# Patient Record
Sex: Male | Born: 1945 | Race: White | Hispanic: No | Marital: Married | Smoking: Never smoker
Health system: Southern US, Community
[De-identification: ages and names within clinical notes are randomized; demographics above are authoritative.]

## PROBLEM LIST (undated history)

## (undated) DIAGNOSIS — I10 Essential (primary) hypertension: Secondary | ICD-10-CM

## (undated) DIAGNOSIS — N289 Disorder of kidney and ureter, unspecified: Secondary | ICD-10-CM

## (undated) DIAGNOSIS — Z96651 Presence of right artificial knee joint: Secondary | ICD-10-CM

## (undated) DIAGNOSIS — R0602 Shortness of breath: Secondary | ICD-10-CM

## (undated) DIAGNOSIS — E785 Hyperlipidemia, unspecified: Secondary | ICD-10-CM

## (undated) DIAGNOSIS — K222 Esophageal obstruction: Secondary | ICD-10-CM

## (undated) DIAGNOSIS — R002 Palpitations: Secondary | ICD-10-CM

## (undated) DIAGNOSIS — I350 Nonrheumatic aortic (valve) stenosis: Secondary | ICD-10-CM

## (undated) HISTORY — DX: Disorder of kidney and ureter, unspecified: N28.9

## (undated) HISTORY — DX: Shortness of breath: R06.02

## (undated) HISTORY — DX: Nonrheumatic aortic (valve) stenosis: I35.0

## (undated) HISTORY — DX: Essential (primary) hypertension: I10

## (undated) HISTORY — DX: Esophageal obstruction: K22.2

## (undated) HISTORY — DX: Palpitations: R00.2

## (undated) HISTORY — DX: Hyperlipidemia, unspecified: E78.5

## (undated) HISTORY — DX: Presence of right artificial knee joint: Z96.651

---

## 2015-01-13 DIAGNOSIS — E785 Hyperlipidemia, unspecified: Secondary | ICD-10-CM

## 2015-01-13 DIAGNOSIS — N289 Disorder of kidney and ureter, unspecified: Secondary | ICD-10-CM

## 2015-01-13 DIAGNOSIS — I10 Essential (primary) hypertension: Secondary | ICD-10-CM | POA: Insufficient documentation

## 2015-01-13 HISTORY — DX: Essential (primary) hypertension: I10

## 2015-01-13 HISTORY — DX: Hyperlipidemia, unspecified: E78.5

## 2015-01-13 HISTORY — DX: Disorder of kidney and ureter, unspecified: N28.9

## 2015-08-24 DIAGNOSIS — Z96651 Presence of right artificial knee joint: Secondary | ICD-10-CM

## 2015-08-24 HISTORY — DX: Presence of right artificial knee joint: Z96.651

## 2016-04-19 DIAGNOSIS — R351 Nocturia: Secondary | ICD-10-CM | POA: Diagnosis not present

## 2016-04-19 DIAGNOSIS — N401 Enlarged prostate with lower urinary tract symptoms: Secondary | ICD-10-CM | POA: Diagnosis not present

## 2016-04-19 DIAGNOSIS — N411 Chronic prostatitis: Secondary | ICD-10-CM | POA: Diagnosis not present

## 2016-04-25 DIAGNOSIS — J309 Allergic rhinitis, unspecified: Secondary | ICD-10-CM | POA: Diagnosis not present

## 2016-04-25 DIAGNOSIS — R197 Diarrhea, unspecified: Secondary | ICD-10-CM | POA: Diagnosis not present

## 2016-04-25 DIAGNOSIS — Z6830 Body mass index (BMI) 30.0-30.9, adult: Secondary | ICD-10-CM | POA: Diagnosis not present

## 2016-05-09 DIAGNOSIS — K589 Irritable bowel syndrome without diarrhea: Secondary | ICD-10-CM | POA: Diagnosis not present

## 2016-05-09 DIAGNOSIS — Z6830 Body mass index (BMI) 30.0-30.9, adult: Secondary | ICD-10-CM | POA: Diagnosis not present

## 2016-05-24 DIAGNOSIS — L570 Actinic keratosis: Secondary | ICD-10-CM | POA: Diagnosis not present

## 2016-06-14 DIAGNOSIS — H25013 Cortical age-related cataract, bilateral: Secondary | ICD-10-CM | POA: Diagnosis not present

## 2016-06-14 DIAGNOSIS — H353121 Nonexudative age-related macular degeneration, left eye, early dry stage: Secondary | ICD-10-CM | POA: Diagnosis not present

## 2016-07-11 DIAGNOSIS — R7301 Impaired fasting glucose: Secondary | ICD-10-CM | POA: Diagnosis not present

## 2016-07-11 DIAGNOSIS — K589 Irritable bowel syndrome without diarrhea: Secondary | ICD-10-CM | POA: Diagnosis not present

## 2016-07-11 DIAGNOSIS — E785 Hyperlipidemia, unspecified: Secondary | ICD-10-CM | POA: Diagnosis not present

## 2016-07-11 DIAGNOSIS — I10 Essential (primary) hypertension: Secondary | ICD-10-CM | POA: Diagnosis not present

## 2016-07-11 DIAGNOSIS — Z9181 History of falling: Secondary | ICD-10-CM | POA: Diagnosis not present

## 2016-07-11 DIAGNOSIS — Z6830 Body mass index (BMI) 30.0-30.9, adult: Secondary | ICD-10-CM | POA: Diagnosis not present

## 2016-07-11 DIAGNOSIS — E039 Hypothyroidism, unspecified: Secondary | ICD-10-CM | POA: Diagnosis not present

## 2016-07-11 DIAGNOSIS — K645 Perianal venous thrombosis: Secondary | ICD-10-CM | POA: Diagnosis not present

## 2016-07-11 DIAGNOSIS — Z1389 Encounter for screening for other disorder: Secondary | ICD-10-CM | POA: Diagnosis not present

## 2016-07-22 DIAGNOSIS — Z6830 Body mass index (BMI) 30.0-30.9, adult: Secondary | ICD-10-CM | POA: Diagnosis not present

## 2016-07-22 DIAGNOSIS — J302 Other seasonal allergic rhinitis: Secondary | ICD-10-CM | POA: Diagnosis not present

## 2016-10-04 DIAGNOSIS — N419 Inflammatory disease of prostate, unspecified: Secondary | ICD-10-CM | POA: Diagnosis not present

## 2016-10-04 DIAGNOSIS — Z6830 Body mass index (BMI) 30.0-30.9, adult: Secondary | ICD-10-CM | POA: Diagnosis not present

## 2016-10-04 DIAGNOSIS — N401 Enlarged prostate with lower urinary tract symptoms: Secondary | ICD-10-CM | POA: Diagnosis not present

## 2016-11-22 DIAGNOSIS — L570 Actinic keratosis: Secondary | ICD-10-CM | POA: Diagnosis not present

## 2016-12-13 DIAGNOSIS — H353131 Nonexudative age-related macular degeneration, bilateral, early dry stage: Secondary | ICD-10-CM | POA: Diagnosis not present

## 2016-12-13 DIAGNOSIS — H2513 Age-related nuclear cataract, bilateral: Secondary | ICD-10-CM | POA: Diagnosis not present

## 2017-01-11 DIAGNOSIS — I10 Essential (primary) hypertension: Secondary | ICD-10-CM | POA: Diagnosis not present

## 2017-01-11 DIAGNOSIS — E785 Hyperlipidemia, unspecified: Secondary | ICD-10-CM | POA: Diagnosis not present

## 2017-01-11 DIAGNOSIS — R7301 Impaired fasting glucose: Secondary | ICD-10-CM | POA: Diagnosis not present

## 2017-01-11 DIAGNOSIS — Z139 Encounter for screening, unspecified: Secondary | ICD-10-CM | POA: Diagnosis not present

## 2017-01-11 DIAGNOSIS — Z125 Encounter for screening for malignant neoplasm of prostate: Secondary | ICD-10-CM | POA: Diagnosis not present

## 2017-01-11 DIAGNOSIS — E039 Hypothyroidism, unspecified: Secondary | ICD-10-CM | POA: Diagnosis not present

## 2017-01-11 DIAGNOSIS — Z6830 Body mass index (BMI) 30.0-30.9, adult: Secondary | ICD-10-CM | POA: Diagnosis not present

## 2017-02-02 DIAGNOSIS — Z1211 Encounter for screening for malignant neoplasm of colon: Secondary | ICD-10-CM | POA: Diagnosis not present

## 2017-02-02 DIAGNOSIS — Z1389 Encounter for screening for other disorder: Secondary | ICD-10-CM | POA: Diagnosis not present

## 2017-02-02 DIAGNOSIS — E785 Hyperlipidemia, unspecified: Secondary | ICD-10-CM | POA: Diagnosis not present

## 2017-02-02 DIAGNOSIS — Z6830 Body mass index (BMI) 30.0-30.9, adult: Secondary | ICD-10-CM | POA: Diagnosis not present

## 2017-02-02 DIAGNOSIS — E669 Obesity, unspecified: Secondary | ICD-10-CM | POA: Diagnosis not present

## 2017-02-02 DIAGNOSIS — Z0000 Encounter for general adult medical examination without abnormal findings: Secondary | ICD-10-CM | POA: Diagnosis not present

## 2017-02-02 DIAGNOSIS — Z136 Encounter for screening for cardiovascular disorders: Secondary | ICD-10-CM | POA: Diagnosis not present

## 2017-02-02 DIAGNOSIS — Z9181 History of falling: Secondary | ICD-10-CM | POA: Diagnosis not present

## 2017-02-02 DIAGNOSIS — Z125 Encounter for screening for malignant neoplasm of prostate: Secondary | ICD-10-CM | POA: Diagnosis not present

## 2017-03-15 DIAGNOSIS — J019 Acute sinusitis, unspecified: Secondary | ICD-10-CM | POA: Diagnosis not present

## 2017-04-05 DIAGNOSIS — N3289 Other specified disorders of bladder: Secondary | ICD-10-CM | POA: Diagnosis not present

## 2017-04-05 DIAGNOSIS — N401 Enlarged prostate with lower urinary tract symptoms: Secondary | ICD-10-CM | POA: Diagnosis not present

## 2017-04-26 ENCOUNTER — Encounter: Payer: Self-pay | Admitting: *Deleted

## 2017-05-03 ENCOUNTER — Ambulatory Visit: Payer: PPO | Admitting: Cardiology

## 2017-05-03 ENCOUNTER — Encounter: Payer: Self-pay | Admitting: Cardiology

## 2017-05-03 VITALS — BP 124/70 | HR 65 | Ht 69.0 in | Wt 199.0 lb

## 2017-05-03 DIAGNOSIS — N289 Disorder of kidney and ureter, unspecified: Secondary | ICD-10-CM

## 2017-05-03 DIAGNOSIS — I10 Essential (primary) hypertension: Secondary | ICD-10-CM

## 2017-05-03 DIAGNOSIS — E785 Hyperlipidemia, unspecified: Secondary | ICD-10-CM | POA: Diagnosis not present

## 2017-05-03 NOTE — Patient Instructions (Signed)
Medication Instructions:  Your physician recommends that you continue on your current medications as directed. Please refer to the Current Medication list given to you today.  Labwork: None  Testing/Procedures: None  Follow-Up: Your physician recommends that you schedule a follow-up appointment in: 8 months  Any Other Special Instructions Will Be Listed Below (If Applicable).     If you need a refill on your cardiac medications before your next appointment, please call your pharmacy.   [facility]  Ashley A, RN, BSN  

## 2017-05-03 NOTE — Progress Notes (Addendum)
Cardiology Office Note:    Date:  05/03/2017   ID:  Duane Hamilton, DOB 1945-09-10, MRN [id number]  PCP:  Nicoletta Dress, MD  Cardiologist:  Jenean Lindau, MD   Referring MD: No ref. provider found    ASSESSMENT:    1. Essential hypertension   2. Renal insufficiency, mild   3. Dyslipidemia    PLAN:    In order of problems listed above:  1. Primary prevention stressed with the patient.  Importance of compliance with diet and medications stressed and he vocalized understanding.  His blood pressure is stable lipids are fine.  He has excellent exercise tolerance.  He is asymptomatic. 2. He will be seen in follow-up appointment in 8 months or earlier if he has any concerns. 3. Patient had blood lab work from his primary care physician's office and I reviewed this and discussed with him at length with the patient questions were answered to satisfaction.   Medication Adjustments/Labs and Tests Ordered: Current medicines are reviewed at length with the patient today.  Concerns regarding medicines are outlined above.  Orders Placed This Encounter  Procedures  . EKG 12-Lead   No orders of the defined types were placed in this encounter.    History of Present Illness:    Duane Hamilton is a 72 y.o. male who is being seen today for the evaluation of essential hypertension and dyslipidemia at the request of No ref. provider found.  This patient has been under my care in my previous practice.  He is here now to transfer his care and be established with my current practice.  Patient is a pleasant 71 year old male with past medical history of essential hypertension, renal insufficiency.  Past Medical History:  Diagnosis Date  . Aortic stenosis   . Dyslipidemia 01/13/2015  . Esophageal stricture   . Essential hypertension 01/13/2015  . Hyperlipidemia   . Palpitations   . Renal insufficiency, mild 01/13/2015  . 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 Shortness of  breath     History reviewed. No pertinent surgical history.  Current Medications: Current Meds  Medication Sig  . aspirin EC 81 MG tablet Take by mouth.  Marland Kitchen azelastine (ASTELIN) 0.1 % nasal spray 1 spray.  . cetirizine (ZYRTEC) 10 MG tablet TAKE 1 TABLET BY MOUTH AS NEEDED  . dicyclomine (BENTYL) 20 MG tablet Take 1 tablet by mouth 3 (three) times daily before meals.  Marland Kitchen levothyroxine (SYNTHROID, LEVOTHROID) 75 MCG tablet Take by mouth.  . metoprolol succinate (TOPROL-XL) 50 MG 24 hr tablet Take by mouth.  . Multiple Vitamins-Minerals (PRESERVISION AREDS PO) Take 1 tablet by mouth daily.  Marland Kitchen omeprazole (PRILOSEC) 20 MG capsule Take by mouth.  . simvastatin (ZOCOR) 40 MG tablet Take by mouth.  . tamsulosin (FLOMAX) 0.4 MG CAPS capsule Take 1 capsule by mouth daily.     Allergies:   Patient has no known allergies.   Social History   Socioeconomic History  . Marital status: Married    Spouse name: None  . Number of children: None  . Years of education: None  . Highest education level: None  Social Needs  . Financial resource strain: None  . Food insecurity - worry: None  . Food insecurity - inability: None  . Transportation needs - medical: None  . Transportation needs - non-medical: None  Occupational History  . None  Tobacco Use  . Smoking status: Never Smoker  . Smokeless tobacco: Never Used  Substance and Sexual  Activity  . Alcohol use: No    Frequency: Never  . Drug use: No  . Sexual activity: None  Other Topics Concern  . None  Social History Narrative  . None     Family History: The patient's family history includes Atrial fibrillation in his father.  ROS:   Please see the history of present illness.    All other systems reviewed and are negative.  EKGs/Labs/Other Studies Reviewed:    The following studies were reviewed today: EKG done today revealed sinus rhythm and nonspecific ST-T changes.   Recent Labs: No results found for requested labs within  last 8760 hours.  Recent Lipid Panel No results found for: CHOL, TRIG, HDL, CHOLHDL, VLDL, LDLCALC, LDLDIRECT  Physical Exam:    VS:  BP 124/70 (BP Location: Right Arm, Patient Position: Sitting, Cuff Size: Large)   Pulse 65   Ht 5\' 9"  (1.753 m)   Wt 199 lb 0.6 oz (90.3 kg)   SpO2 100%   BMI 29.39 kg/m     Wt Readings from Last 3 Encounters:  05/03/17 199 lb 0.6 oz (90.3 kg)     GEN: Patient is in no acute distress HEENT: Normal NECK: No JVD; No carotid bruits LYMPHATICS: No lymphadenopathy CARDIAC: S1 S2 regular, 2/6 systolic murmur at the apex. RESPIRATORY:  Clear to auscultation without rales, wheezing or rhonchi  ABDOMEN: Soft, non-tender, non-distended MUSCULOSKELETAL:  No edema; No deformity  SKIN: Warm and dry NEUROLOGIC:  Alert and oriented x 3 PSYCHIATRIC:  Normal affect    Signed, Jenean Lindau, MD  05/03/2017 11:05 AM    [place]

## 2017-05-30 DIAGNOSIS — L570 Actinic keratosis: Secondary | ICD-10-CM | POA: Diagnosis not present

## 2017-06-15 DIAGNOSIS — H524 Presbyopia: Secondary | ICD-10-CM | POA: Diagnosis not present

## 2017-06-15 DIAGNOSIS — H25813 Combined forms of age-related cataract, bilateral: Secondary | ICD-10-CM | POA: Diagnosis not present

## 2017-06-15 DIAGNOSIS — H353131 Nonexudative age-related macular degeneration, bilateral, early dry stage: Secondary | ICD-10-CM | POA: Diagnosis not present

## 2017-07-13 DIAGNOSIS — E039 Hypothyroidism, unspecified: Secondary | ICD-10-CM | POA: Diagnosis not present

## 2017-07-13 DIAGNOSIS — I10 Essential (primary) hypertension: Secondary | ICD-10-CM | POA: Diagnosis not present

## 2017-07-13 DIAGNOSIS — Z139 Encounter for screening, unspecified: Secondary | ICD-10-CM | POA: Diagnosis not present

## 2017-07-13 DIAGNOSIS — E785 Hyperlipidemia, unspecified: Secondary | ICD-10-CM | POA: Diagnosis not present

## 2017-07-13 DIAGNOSIS — Z1331 Encounter for screening for depression: Secondary | ICD-10-CM | POA: Diagnosis not present

## 2017-07-13 DIAGNOSIS — Z6830 Body mass index (BMI) 30.0-30.9, adult: Secondary | ICD-10-CM | POA: Diagnosis not present

## 2017-07-13 DIAGNOSIS — R7301 Impaired fasting glucose: Secondary | ICD-10-CM | POA: Diagnosis not present

## 2017-07-27 DIAGNOSIS — K210 Gastro-esophageal reflux disease with esophagitis: Secondary | ICD-10-CM | POA: Diagnosis not present

## 2017-07-27 DIAGNOSIS — Z8719 Personal history of other diseases of the digestive system: Secondary | ICD-10-CM | POA: Diagnosis not present

## 2017-08-14 DIAGNOSIS — J019 Acute sinusitis, unspecified: Secondary | ICD-10-CM | POA: Diagnosis not present

## 2017-08-14 DIAGNOSIS — Z6831 Body mass index (BMI) 31.0-31.9, adult: Secondary | ICD-10-CM | POA: Diagnosis not present

## 2017-08-24 DIAGNOSIS — J019 Acute sinusitis, unspecified: Secondary | ICD-10-CM | POA: Diagnosis not present

## 2017-10-05 DIAGNOSIS — N3289 Other specified disorders of bladder: Secondary | ICD-10-CM | POA: Diagnosis not present

## 2017-10-05 DIAGNOSIS — N401 Enlarged prostate with lower urinary tract symptoms: Secondary | ICD-10-CM | POA: Diagnosis not present

## 2017-10-21 DIAGNOSIS — S6392XA Sprain of unspecified part of left wrist and hand, initial encounter: Secondary | ICD-10-CM | POA: Diagnosis not present

## 2017-10-21 DIAGNOSIS — T7840XA Allergy, unspecified, initial encounter: Secondary | ICD-10-CM | POA: Diagnosis not present

## 2017-10-21 DIAGNOSIS — M79641 Pain in right hand: Secondary | ICD-10-CM | POA: Diagnosis not present

## 2017-11-28 DIAGNOSIS — L570 Actinic keratosis: Secondary | ICD-10-CM | POA: Diagnosis not present

## 2017-12-02 DIAGNOSIS — I499 Cardiac arrhythmia, unspecified: Secondary | ICD-10-CM | POA: Diagnosis not present

## 2017-12-02 DIAGNOSIS — R002 Palpitations: Secondary | ICD-10-CM | POA: Diagnosis not present

## 2017-12-02 DIAGNOSIS — R072 Precordial pain: Secondary | ICD-10-CM | POA: Diagnosis not present

## 2017-12-02 DIAGNOSIS — R7989 Other specified abnormal findings of blood chemistry: Secondary | ICD-10-CM | POA: Diagnosis not present

## 2017-12-02 DIAGNOSIS — I10 Essential (primary) hypertension: Secondary | ICD-10-CM | POA: Diagnosis not present

## 2017-12-02 DIAGNOSIS — I4891 Unspecified atrial fibrillation: Secondary | ICD-10-CM | POA: Diagnosis not present

## 2017-12-02 DIAGNOSIS — R000 Tachycardia, unspecified: Secondary | ICD-10-CM | POA: Diagnosis not present

## 2017-12-02 DIAGNOSIS — K219 Gastro-esophageal reflux disease without esophagitis: Secondary | ICD-10-CM | POA: Diagnosis not present

## 2017-12-02 DIAGNOSIS — R0789 Other chest pain: Secondary | ICD-10-CM | POA: Diagnosis not present

## 2017-12-02 DIAGNOSIS — E785 Hyperlipidemia, unspecified: Secondary | ICD-10-CM | POA: Diagnosis not present

## 2017-12-02 DIAGNOSIS — R079 Chest pain, unspecified: Secondary | ICD-10-CM | POA: Diagnosis not present

## 2017-12-02 DIAGNOSIS — E860 Dehydration: Secondary | ICD-10-CM | POA: Diagnosis not present

## 2017-12-02 DIAGNOSIS — N179 Acute kidney failure, unspecified: Secondary | ICD-10-CM | POA: Diagnosis not present

## 2017-12-03 DIAGNOSIS — R002 Palpitations: Secondary | ICD-10-CM | POA: Diagnosis not present

## 2017-12-03 DIAGNOSIS — K219 Gastro-esophageal reflux disease without esophagitis: Secondary | ICD-10-CM | POA: Diagnosis not present

## 2017-12-03 DIAGNOSIS — I10 Essential (primary) hypertension: Secondary | ICD-10-CM | POA: Diagnosis not present

## 2017-12-03 DIAGNOSIS — R079 Chest pain, unspecified: Secondary | ICD-10-CM

## 2017-12-03 DIAGNOSIS — N179 Acute kidney failure, unspecified: Secondary | ICD-10-CM | POA: Diagnosis not present

## 2017-12-03 DIAGNOSIS — E785 Hyperlipidemia, unspecified: Secondary | ICD-10-CM | POA: Diagnosis not present

## 2017-12-04 DIAGNOSIS — R079 Chest pain, unspecified: Secondary | ICD-10-CM | POA: Diagnosis not present

## 2017-12-04 DIAGNOSIS — N179 Acute kidney failure, unspecified: Secondary | ICD-10-CM | POA: Diagnosis not present

## 2017-12-05 ENCOUNTER — Encounter: Payer: Self-pay | Admitting: Cardiology

## 2017-12-05 ENCOUNTER — Ambulatory Visit (INDEPENDENT_AMBULATORY_CARE_PROVIDER_SITE_OTHER): Payer: PPO | Admitting: Cardiology

## 2017-12-05 VITALS — BP 118/64 | HR 80 | Resp 10 | Ht 69.0 in | Wt 199.1 lb

## 2017-12-05 DIAGNOSIS — N289 Disorder of kidney and ureter, unspecified: Secondary | ICD-10-CM | POA: Diagnosis not present

## 2017-12-05 DIAGNOSIS — I2510 Atherosclerotic heart disease of native coronary artery without angina pectoris: Secondary | ICD-10-CM

## 2017-12-05 DIAGNOSIS — I10 Essential (primary) hypertension: Secondary | ICD-10-CM

## 2017-12-05 DIAGNOSIS — E785 Hyperlipidemia, unspecified: Secondary | ICD-10-CM

## 2017-12-05 HISTORY — DX: Atherosclerotic heart disease of native coronary artery without angina pectoris: I25.10

## 2017-12-05 NOTE — Progress Notes (Signed)
Cardiology Office Note:    Date:  12/05/2017   ID:  CROSLEY STEJSKAL, DOB Feb 12, 1946, MRN [id number]  PCP:  Nicoletta Dress, MD  Cardiologist:  Jenean Lindau, MD   Referring MD: Nicoletta Dress, MD    ASSESSMENT:    1. Essential hypertension   2. Renal insufficiency, mild   3. Dyslipidemia   4. Coronary artery disease involving native coronary artery of native heart without angina pectoris    PLAN:    In order of problems listed above:  1. Secondary prevention stressed with the patient.  Importance of compliance with diet and medication stressed and he vocalized understanding.  A CT scan to rule out PE revealed significant coronary atherosclerosis. 2. This we will monitor the patient's lipids aggressively. 3. He also has renal insufficiency and received dye for the CT scan.  I have asked him to come back in 2 weeks with a pulse blood pressure check.  He will keep a track of his blood pressures at home and bring them to me.  This will be done at the hospital office but he will also get a Chem-7. 4. He will be back in a month for blood work including liver lipid check 5. Patient will be seen in follow-up appointment in 6 months or earlier if the patient has any concerns 6. Importance of regular exercise stressed in detail to him.  He vocalized understanding   Medication Adjustments/Labs and Tests Ordered: Current medicines are reviewed at length with the patient today.  Concerns regarding medicines are outlined above.  Orders Placed This Encounter  Procedures  . Basic Metabolic Panel (BMET)  . Basic Metabolic Panel (BMET)  . TSH  . Hepatic function panel  . Lipid Profile   No orders of the defined types were placed in this encounter.    Chief Complaint  Patient presents with  . Follow-up  . Hospitalization Follow-up     History of Present Illness:    TREYVIN GLIDDEN is a 72 y.o. male.  The patient was admitted to [facility].  He mentions to me that he  had an episode of palpitations and went to [place] hospital.  His son accompanies him for this visit and mentions to me that the patient had overexerted himself for a couple of days even leading up to the day that he went to the hospital.  No orthopnea or PND.  He had some issues of chest pain.  Past Medical History:  Diagnosis Date  . Aortic stenosis   . Dyslipidemia 01/13/2015  . Esophageal stricture   . Essential hypertension 01/13/2015  . Hyperlipidemia   . Palpitations   . Renal insufficiency, mild 01/13/2015  . 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 Shortness of breath     History reviewed. No pertinent surgical history.  Current Medications: Current Meds  Medication Sig  . aspirin EC 81 MG tablet Take by mouth.  Marland Kitchen azelastine (ASTELIN) 0.1 % nasal spray 1 spray.  . cetirizine (ZYRTEC) 10 MG tablet TAKE 1 TABLET BY MOUTH AS NEEDED  . levothyroxine (SYNTHROID, LEVOTHROID) 75 MCG tablet Take by mouth.  Marland Kitchen lisinopril (PRINIVIL,ZESTRIL) 2.5 MG tablet Take 1 tablet by mouth daily.  . metoprolol succinate (TOPROL-XL) 50 MG 24 hr tablet Take by mouth.  . Multiple Vitamins-Minerals (PRESERVISION AREDS PO) Take 1 tablet by mouth daily.  . pantoprazole (PROTONIX) 40 MG tablet Take 1 tablet by mouth daily.  . simvastatin (ZOCOR) 40 MG tablet Take by mouth.  Marland Kitchen  tamsulosin (FLOMAX) 0.4 MG CAPS capsule Take 1 capsule by mouth daily.     Allergies:   Patient has no known allergies.   Social History   Socioeconomic History  . Marital status: Married    Spouse name: Not on file  . Number of children: Not on file  . Years of education: Not on file  . Highest education level: Not on file  Occupational History  . Not on file  Social Needs  . Financial resource strain: Not on file  . Food insecurity:    Worry: Not on file    Inability: Not on file  . Transportation needs:    Medical: Not on file    Non-medical: Not on file  Tobacco Use  . Smoking status: Never Smoker   . Smokeless tobacco: Never Used  Substance and Sexual Activity  . Alcohol use: No    Frequency: Never  . Drug use: No  . Sexual activity: Not on file  Lifestyle  . Physical activity:    Days per week: Not on file    Minutes per session: Not on file  . Stress: Not on file  Relationships  . Social connections:    Talks on phone: Not on file    Gets together: Not on file    Attends religious service: Not on file    Active member of club or organization: Not on file    Attends meetings of clubs or organizations: Not on file    Relationship status: Not on file  Other Topics Concern  . Not on file  Social History Narrative  . Not on file     Family History: The patient's family history includes Atrial fibrillation in his father.  ROS:   Please see the history of present illness.    All other systems reviewed and are negative.  EKGs/Labs/Other Studies Reviewed:    The following studies were reviewed today: I reviewed hospital records extensively and discussed with the patient at length.   Recent Labs: No results found for requested labs within last 8760 hours.  Recent Lipid Panel No results found for: CHOL, TRIG, HDL, CHOLHDL, VLDL, LDLCALC, LDLDIRECT  Physical Exam:    VS:  BP 118/64   Pulse 80   Resp 10   Ht 5\' 9"  (1.753 m)   Wt 199 lb 1.9 oz (90.3 kg)   BMI 29.40 kg/m     Wt Readings from Last 3 Encounters:  12/05/17 199 lb 1.9 oz (90.3 kg)  05/03/17 199 lb 0.6 oz (90.3 kg)     GEN: Patient is in no acute distress HEENT: Normal NECK: No JVD; No carotid bruits LYMPHATICS: No lymphadenopathy CARDIAC: Hear sounds regular, 2/6 systolic murmur at the apex. RESPIRATORY:  Clear to auscultation without rales, wheezing or rhonchi  ABDOMEN: Soft, non-tender, non-distended MUSCULOSKELETAL:  No edema; No deformity  SKIN: Warm and dry NEUROLOGIC:  Alert and oriented x 3 PSYCHIATRIC:  Normal affect   Signed, Jenean Lindau, MD  12/05/2017 10:43 AM    [place]

## 2017-12-05 NOTE — Patient Instructions (Addendum)
Medication Instructions:  Your physician recommends that you continue on your current medications as directed. Please refer to the Current Medication list given to you today.   Labwork: Your physician recommends that you return for lab work in 6 weeks (01/16/18): BMP, TSH, LFT, lipid panel. Please fast beforehand. You can go to our [place] office to have this lab work completed located at [facility] in [place].   Testing/Procedures: None  Follow-Up: Your physician wants you to follow-up in: 6 months. You will receive a reminder letter in the mail two months in advance. If you don't receive a letter, please call our office to schedule the follow-up appointment.  **Dr. Geraldo Pitter recommends you keep a log of your daily blood pressure and heart rate. Bring this information with you to your nurse visit appointment in the [place] office in 2 weeks.    If you need a refill on your cardiac medications before your next appointment, please call your pharmacy.   Thank you for choosing [facility]! Robyne Peers, RN [phone]

## 2017-12-11 ENCOUNTER — Other Ambulatory Visit: Payer: Self-pay

## 2017-12-11 NOTE — Patient Outreach (Addendum)
[facility]) Care Management  12/11/2017  Duane Hamilton [birth year] [id number]  Telephone screening Referral date: 12/11/17 Referral source: discharged from [facility] on 12/04/17 Insurance: health team advantage Attempt # 1  Telephone call to patient regarding transition of care referral. Unable to reach patient. HIPAA compliant voice message left with call back phone number.   PLAN: RNCM will attempt 2nd telephone call to patient within 4 business days.  RNCM will send patient outreach letter to attempt contact  Quinn Plowman RN,BSN,CCM [facility] Telephonic  [phone]

## 2017-12-12 ENCOUNTER — Other Ambulatory Visit: Payer: Self-pay

## 2017-12-12 NOTE — Patient Outreach (Signed)
[facility]) Care Management  12/12/2017  Duane Hamilton 02-Apr-1946 [id number]  Transition of care  Referral date: 12/11/17 Referral source:  Discharged from [facility] on 12/04/17 Insurance: health team advantage  Transition of care will be completed by primary care provider office who will refer to [facility] care management if needed.   PLAN: RNCM will close patient due to patient being enrolled in an external program.  Quinn Plowman RN,BSN,CCM [facility] Telephonic  [phone]

## 2017-12-14 ENCOUNTER — Ambulatory Visit: Payer: Self-pay

## 2017-12-15 ENCOUNTER — Other Ambulatory Visit: Payer: Self-pay

## 2017-12-15 NOTE — Patient Outreach (Signed)
[facility]) Care Management  12/15/2017  AIMAR BORGHI 1945-06-02 [id number]  Transition of care  Referral date: 12/11/17 Referral source:  Discharged from [facility] on 12/04/17 Insurance: health team advantage   Transition of care will be completed by primary care provider office who will refer to [facility] care management if needed.   PLAN: RNCM will close patient due to patient being enrolled in an external program.  Quinn Plowman RN,BSN,CCM [facility] Telephonic  [phone]

## 2017-12-19 ENCOUNTER — Ambulatory Visit (INDEPENDENT_AMBULATORY_CARE_PROVIDER_SITE_OTHER): Payer: PPO | Admitting: Cardiology

## 2017-12-19 VITALS — BP 140/70 | HR 60 | Resp 16 | Ht 69.0 in | Wt 203.0 lb

## 2017-12-19 DIAGNOSIS — I10 Essential (primary) hypertension: Secondary | ICD-10-CM

## 2017-12-19 MED ORDER — LISINOPRIL 2.5 MG PO TABS: 2.5000 mg | ORAL_TABLET | Freq: Every day | ORAL | 3 refills | Status: DC

## 2017-12-19 NOTE — Patient Instructions (Signed)
Medication Instructions:  Your physician recommends that you continue on your current medications as directed. Please refer to the Current Medication list given to you today.   Labwork: None.  Testing/Procedures: None  Follow-Up: Follow up as previously directed.   Any Other Special Instructions Will Be Listed Below (If Applicable).     If you need a refill on your cardiac medications before your next appointment, please call your pharmacy.

## 2017-12-19 NOTE — Progress Notes (Signed)
Patient here for bp check per Dr. Geraldo Pitter. Patient bp 140/70 and patient brings copy of bp log at home. All values within normal limits. Dr. Bettina Gavia aware. Patient advised to continue current management. Patient verbally understands.

## 2018-01-01 DIAGNOSIS — Z86010 Personal history of colonic polyps: Secondary | ICD-10-CM | POA: Diagnosis not present

## 2018-01-12 DIAGNOSIS — Z23 Encounter for immunization: Secondary | ICD-10-CM | POA: Diagnosis not present

## 2018-01-12 DIAGNOSIS — E039 Hypothyroidism, unspecified: Secondary | ICD-10-CM | POA: Diagnosis not present

## 2018-01-12 DIAGNOSIS — K219 Gastro-esophageal reflux disease without esophagitis: Secondary | ICD-10-CM | POA: Diagnosis not present

## 2018-01-12 DIAGNOSIS — E785 Hyperlipidemia, unspecified: Secondary | ICD-10-CM | POA: Diagnosis not present

## 2018-01-12 DIAGNOSIS — R7301 Impaired fasting glucose: Secondary | ICD-10-CM | POA: Diagnosis not present

## 2018-01-12 DIAGNOSIS — Z6830 Body mass index (BMI) 30.0-30.9, adult: Secondary | ICD-10-CM | POA: Diagnosis not present

## 2018-01-12 DIAGNOSIS — R002 Palpitations: Secondary | ICD-10-CM | POA: Diagnosis not present

## 2018-01-12 DIAGNOSIS — I10 Essential (primary) hypertension: Secondary | ICD-10-CM | POA: Diagnosis not present

## 2018-01-17 ENCOUNTER — Telehealth: Payer: Self-pay

## 2018-01-17 ENCOUNTER — Other Ambulatory Visit: Payer: Self-pay

## 2018-01-17 DIAGNOSIS — R002 Palpitations: Secondary | ICD-10-CM

## 2018-01-17 NOTE — Telephone Encounter (Signed)
-----   Message from Jenean Lindau, MD sent at 01/17/2018  2:05 PM EDT ----- Regarding: RE: Patient states that he had labs at PCP  Contact: [phone] 48 hr holter ----- Message ----- From: Mattie Marlin, RN Sent: 01/17/2018  11:34 AM EDT To: Jenean Lindau, MD Subject: FW: Patient states that he had labs at PCP     Labs have been requested, please advise?  ----- Message ----- From: Frederic Jericho Sent: 01/16/2018   9:38 AM EDT To: Mattie Marlin, RN Subject: Patient states that he had labs at PCP         Patient states that he had his lab at [facility] and he also he mentioned that he has had som irratic heartrate on his BP monitor and Delena Bali mentioned that we might want to put on a monitor.

## 2018-01-17 NOTE — Telephone Encounter (Signed)
Informed patient that 48 hour monitor is being ordered.

## 2018-02-07 DIAGNOSIS — E039 Hypothyroidism, unspecified: Secondary | ICD-10-CM | POA: Diagnosis not present

## 2018-02-07 DIAGNOSIS — E860 Dehydration: Secondary | ICD-10-CM | POA: Diagnosis not present

## 2018-02-07 DIAGNOSIS — R000 Tachycardia, unspecified: Secondary | ICD-10-CM | POA: Diagnosis not present

## 2018-02-07 DIAGNOSIS — I10 Essential (primary) hypertension: Secondary | ICD-10-CM | POA: Diagnosis not present

## 2018-02-07 DIAGNOSIS — R002 Palpitations: Secondary | ICD-10-CM | POA: Diagnosis not present

## 2018-02-07 DIAGNOSIS — I491 Atrial premature depolarization: Secondary | ICD-10-CM | POA: Diagnosis not present

## 2018-02-07 DIAGNOSIS — K219 Gastro-esophageal reflux disease without esophagitis: Secondary | ICD-10-CM | POA: Diagnosis not present

## 2018-02-07 DIAGNOSIS — R531 Weakness: Secondary | ICD-10-CM | POA: Diagnosis not present

## 2018-02-07 DIAGNOSIS — N179 Acute kidney failure, unspecified: Secondary | ICD-10-CM | POA: Diagnosis not present

## 2018-02-09 ENCOUNTER — Ambulatory Visit (INDEPENDENT_AMBULATORY_CARE_PROVIDER_SITE_OTHER): Payer: PPO

## 2018-02-09 DIAGNOSIS — R002 Palpitations: Secondary | ICD-10-CM | POA: Diagnosis not present

## 2018-02-13 DIAGNOSIS — Z139 Encounter for screening, unspecified: Secondary | ICD-10-CM | POA: Diagnosis not present

## 2018-02-13 DIAGNOSIS — N179 Acute kidney failure, unspecified: Secondary | ICD-10-CM | POA: Diagnosis not present

## 2018-02-13 DIAGNOSIS — E785 Hyperlipidemia, unspecified: Secondary | ICD-10-CM | POA: Diagnosis not present

## 2018-02-13 DIAGNOSIS — Z125 Encounter for screening for malignant neoplasm of prostate: Secondary | ICD-10-CM | POA: Diagnosis not present

## 2018-02-13 DIAGNOSIS — Z1339 Encounter for screening examination for other mental health and behavioral disorders: Secondary | ICD-10-CM | POA: Diagnosis not present

## 2018-02-13 DIAGNOSIS — Z0000 Encounter for general adult medical examination without abnormal findings: Secondary | ICD-10-CM | POA: Diagnosis not present

## 2018-02-13 DIAGNOSIS — E669 Obesity, unspecified: Secondary | ICD-10-CM | POA: Diagnosis not present

## 2018-02-13 DIAGNOSIS — Z6831 Body mass index (BMI) 31.0-31.9, adult: Secondary | ICD-10-CM | POA: Diagnosis not present

## 2018-02-13 DIAGNOSIS — Z9181 History of falling: Secondary | ICD-10-CM | POA: Diagnosis not present

## 2018-02-20 DIAGNOSIS — R002 Palpitations: Secondary | ICD-10-CM | POA: Diagnosis not present

## 2018-02-20 DIAGNOSIS — S29012A Strain of muscle and tendon of back wall of thorax, initial encounter: Secondary | ICD-10-CM | POA: Diagnosis not present

## 2018-02-20 DIAGNOSIS — M940 Chondrocostal junction syndrome [Tietze]: Secondary | ICD-10-CM | POA: Diagnosis not present

## 2018-02-20 DIAGNOSIS — Z6831 Body mass index (BMI) 31.0-31.9, adult: Secondary | ICD-10-CM | POA: Diagnosis not present

## 2018-02-28 DIAGNOSIS — H353121 Nonexudative age-related macular degeneration, left eye, early dry stage: Secondary | ICD-10-CM | POA: Diagnosis not present

## 2018-02-28 DIAGNOSIS — H25813 Combined forms of age-related cataract, bilateral: Secondary | ICD-10-CM | POA: Diagnosis not present

## 2018-03-08 ENCOUNTER — Ambulatory Visit: Payer: PPO | Admitting: Cardiology

## 2018-03-12 ENCOUNTER — Encounter: Payer: Self-pay | Admitting: Cardiology

## 2018-03-12 ENCOUNTER — Ambulatory Visit (INDEPENDENT_AMBULATORY_CARE_PROVIDER_SITE_OTHER): Payer: PPO | Admitting: Cardiology

## 2018-03-12 VITALS — BP 140/76 | HR 64 | Ht 69.0 in | Wt 205.0 lb

## 2018-03-12 DIAGNOSIS — I10 Essential (primary) hypertension: Secondary | ICD-10-CM | POA: Diagnosis not present

## 2018-03-12 DIAGNOSIS — R002 Palpitations: Secondary | ICD-10-CM

## 2018-03-12 NOTE — Patient Instructions (Signed)
Medication Instructions:  Your physician recommends that you continue on your current medications as directed. Please refer to the Current Medication list given to you today.  If you need a refill on your cardiac medications before your next appointment, please call your pharmacy.   Lab work: None  If you have labs (blood work) drawn today and your tests are completely normal, you will receive your results only by: Marland Kitchen MyChart Message (if you have MyChart) OR . A paper copy in the mail If you have any lab test that is abnormal or we need to change your treatment, we will call you to review the results.  Testing/Procedures: Your physician has recommended that you wear a holter monitor. Holter monitors are medical devices that record the heart's electrical activity. Doctors most often use these monitors to diagnose arrhythmias. Arrhythmias are problems with the speed or rhythm of the heartbeat. The monitor is a small, portable device. You can wear one while you do your normal daily activities. This is usually used to diagnose what is causing palpitations/syncope (passing out).  Follow-Up: At [facility], you and your health needs are our priority.  As part of our continuing mission to provide you with exceptional heart care, we have created designated Provider Care Teams.  These Care Teams include your primary Cardiologist (physician) and Advanced Practice Providers (APPs -  Physician Assistants and Nurse Practitioners) who all work together to provide you with the care you need, when you need it.  You will need a follow up appointment in 6 months.  Please call our office 2 months in advance to schedule this appointment.  You may see another member of our [employer] Provider Team in [place]: Jenne Campus, MD . Shirlee More, MD  Any Other Special Instructions Will Be Listed Below (If Applicable).

## 2018-03-12 NOTE — Progress Notes (Signed)
Cardiology Office Note:    Date:  03/12/2018   ID:  Duane Hamilton, DOB November 16, 1945, MRN [id number]  PCP:  Nicoletta Dress, MD  Cardiologist:  Jenean Lindau, MD   Referring MD: Nicoletta Dress, MD    ASSESSMENT:    1. Essential hypertension   2. Palpitations    PLAN:    In order of problems listed above:  1. Primary prevention stressed to the patient.  Importance of compliance with diet and medication stressed and he vocalized understanding.  I reviewed lab work done by his primary care physician.  He has mild renal insufficiency.  He mentions to me that he walks half an hour on a regular basis without any symptoms. 2. His sensation of abnormal heartbeat and palpitations at times is of concern to him it generally happens at rest and therefore we will do a 14-day monitoring.  He tells me that his thyroid function done by his primary care physician is normal. 3. Patient will be seen in follow-up appointment in 6 months or earlier if the patient has any concerns    Medication Adjustments/Labs and Tests Ordered: Current medicines are reviewed at length with the patient today.  Concerns regarding medicines are outlined above.  Orders Placed This Encounter  Procedures  . LONG TERM MONITOR (3-14 DAYS)   No orders of the defined types were placed in this encounter.    No chief complaint on file.    History of Present Illness:    Duane Hamilton is a 72 y.o. male.  Patient has history of essential hypertension palpitations.  He also has renal insufficiency.  He denies any problems at this time and takes care of activities of daily living.  No chest pain orthopnea or PND.  Patient denies any chest pain.  He walks 30 minutes or most of the times without any problems.  He complains of a skipped beat-like sensation and may be palpitations at times at rest.  Past Medical History:  Diagnosis Date  . Aortic stenosis   . Dyslipidemia 01/13/2015  . Esophageal stricture   .  Essential hypertension 01/13/2015  . Hyperlipidemia   . Palpitations   . Renal insufficiency, mild 01/13/2015  . 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 Shortness of breath     History reviewed. No pertinent surgical history.  Current Medications: Current Meds  Medication Sig  . aspirin EC 81 MG tablet Take 81 mg by mouth daily.   Marland Kitchen azelastine (ASTELIN) 0.1 % nasal spray Place 2 sprays into both nostrils as needed.   . cetirizine (ZYRTEC) 10 MG tablet TAKE 1 TABLET BY MOUTH AS NEEDED  . levothyroxine (SYNTHROID, LEVOTHROID) 75 MCG tablet Take 75 mcg by mouth daily before breakfast.   . lisinopril (PRINIVIL,ZESTRIL) 2.5 MG tablet Take 1 tablet (2.5 mg total) by mouth daily.  . metoprolol succinate (TOPROL-XL) 50 MG 24 hr tablet Take 50 mg by mouth daily.   . Multiple Vitamins-Minerals (PRESERVISION AREDS PO) Take 1 tablet by mouth daily.  . pantoprazole (PROTONIX) 40 MG tablet Take 1 tablet by mouth daily.  . simvastatin (ZOCOR) 40 MG tablet Take 40 mg by mouth daily at 6 PM.   . tamsulosin (FLOMAX) 0.4 MG CAPS capsule Take 1 capsule by mouth daily.     Allergies:   Patient has no known allergies.   Social History   Socioeconomic History  . Marital status: Married    Spouse name: Not on file  . Number of children:  Not on file  . Years of education: Not on file  . Highest education level: Not on file  Occupational History  . Not on file  Social Needs  . Financial resource strain: Not on file  . Food insecurity:    Worry: Not on file    Inability: Not on file  . Transportation needs:    Medical: Not on file    Non-medical: Not on file  Tobacco Use  . Smoking status: Never Smoker  . Smokeless tobacco: Never Used  Substance and Sexual Activity  . Alcohol use: No    Frequency: Never  . Drug use: No  . Sexual activity: Not on file  Lifestyle  . Physical activity:    Days per week: Not on file    Minutes per session: Not on file  . Stress: Not on file    Relationships  . Social connections:    Talks on phone: Not on file    Gets together: Not on file    Attends religious service: Not on file    Active member of club or organization: Not on file    Attends meetings of clubs or organizations: Not on file    Relationship status: Not on file  Other Topics Concern  . Not on file  Social History Narrative  . Not on file     Family History: The patient's family history includes Atrial fibrillation in his father.  ROS:   Please see the history of present illness.    All other systems reviewed and are negative.  EKGs/Labs/Other Studies Reviewed:    The following studies were reviewed today: I discussed my findings with the patient at extensive length including the Holter monitor and stress test done in the past few months   Recent Labs: No results found for requested labs within last 8760 hours.  Recent Lipid Panel No results found for: CHOL, TRIG, HDL, CHOLHDL, VLDL, LDLCALC, LDLDIRECT  Physical Exam:    VS:  BP 140/76 (BP Location: Right Arm, Patient Position: Sitting, Cuff Size: Normal)   Pulse 64   Ht 5\' 9"  (1.753 m)   Wt 205 lb (93 kg)   SpO2 99%   BMI 30.27 kg/m     Wt Readings from Last 3 Encounters:  03/12/18 205 lb (93 kg)  12/19/17 203 lb (92.1 kg)  12/05/17 199 lb 1.9 oz (90.3 kg)     GEN: Patient is in no acute distress HEENT: Normal NECK: No JVD; No carotid bruits LYMPHATICS: No lymphadenopathy CARDIAC: Hear sounds regular, 2/6 systolic murmur at the apex. RESPIRATORY:  Clear to auscultation without rales, wheezing or rhonchi  ABDOMEN: Soft, non-tender, non-distended MUSCULOSKELETAL:  No edema; No deformity  SKIN: Warm and dry NEUROLOGIC:  Alert and oriented x 3 PSYCHIATRIC:  Normal affect   Signed, Jenean Lindau, MD  03/12/2018 9:57 AM    [facility]

## 2018-04-04 ENCOUNTER — Ambulatory Visit (INDEPENDENT_AMBULATORY_CARE_PROVIDER_SITE_OTHER): Payer: PPO

## 2018-04-04 DIAGNOSIS — R002 Palpitations: Secondary | ICD-10-CM | POA: Diagnosis not present

## 2018-04-09 DIAGNOSIS — N3289 Other specified disorders of bladder: Secondary | ICD-10-CM | POA: Diagnosis not present

## 2018-04-09 DIAGNOSIS — N289 Disorder of kidney and ureter, unspecified: Secondary | ICD-10-CM | POA: Diagnosis not present

## 2018-04-09 DIAGNOSIS — N401 Enlarged prostate with lower urinary tract symptoms: Secondary | ICD-10-CM | POA: Diagnosis not present

## 2018-04-13 DIAGNOSIS — R0981 Nasal congestion: Secondary | ICD-10-CM | POA: Diagnosis not present

## 2018-04-30 ENCOUNTER — Encounter: Payer: Self-pay | Admitting: Cardiology

## 2018-05-14 ENCOUNTER — Telehealth: Payer: Self-pay | Admitting: Emergency Medicine

## 2018-05-14 NOTE — Telephone Encounter (Signed)
Patient came in office requesting monitor results due to him having the same symptoms with chest tightness this weekend. Dr. Deboraha Sprang patient and appointment this week, he preferred to wait and hear results from our monitor. Informed patient that Dr. Geraldo Pitter  will review results and call him. Patient informed to go to them emergency department for any further chest pain, or chest tightness patient verbally understands.

## 2018-05-15 ENCOUNTER — Other Ambulatory Visit: Payer: Self-pay

## 2018-05-15 DIAGNOSIS — I2510 Atherosclerotic heart disease of native coronary artery without angina pectoris: Secondary | ICD-10-CM

## 2018-05-15 DIAGNOSIS — I10 Essential (primary) hypertension: Secondary | ICD-10-CM | POA: Diagnosis not present

## 2018-05-15 DIAGNOSIS — E785 Hyperlipidemia, unspecified: Secondary | ICD-10-CM

## 2018-05-16 DIAGNOSIS — N401 Enlarged prostate with lower urinary tract symptoms: Secondary | ICD-10-CM | POA: Diagnosis not present

## 2018-05-16 DIAGNOSIS — N411 Chronic prostatitis: Secondary | ICD-10-CM | POA: Diagnosis not present

## 2018-05-16 LAB — BASIC METABOLIC PANEL
BUN/Creatinine Ratio: 11 (ref 10–24)
BUN: 16 mg/dL (ref 8–27)
CALCIUM: 9.9 mg/dL (ref 8.6–10.2)
CO2: 23 mmol/L (ref 20–29)
Chloride: 99 mmol/L (ref 96–106)
Creatinine, Ser: 1.43 mg/dL — ABNORMAL HIGH (ref 0.76–1.27)
GFR calc non Af Amer: 48 mL/min/{1.73_m2} — ABNORMAL LOW (ref >59)
GFR, EST AFRICAN AMERICAN: 56 mL/min/{1.73_m2} — AB (ref >59)
Glucose: 99 mg/dL (ref 65–99)
POTASSIUM: 5 mmol/L (ref 3.5–5.2)
Sodium: 135 mmol/L (ref 134–144)

## 2018-05-16 LAB — LIPID PANEL
CHOL/HDL RATIO: 3.2 ratio (ref 0.0–5.0)
Cholesterol, Total: 161 mg/dL (ref 100–199)
HDL: 51 mg/dL (ref >39)
LDL Calculated: 93 mg/dL (ref 0–99)
Triglycerides: 86 mg/dL (ref 0–149)
VLDL Cholesterol Cal: 17 mg/dL (ref 5–40)

## 2018-05-16 LAB — CBC
Hematocrit: 42.5 % (ref 37.5–51.0)
Hemoglobin: 14.5 g/dL (ref 13.0–17.7)
MCH: 30.5 pg (ref 26.6–33.0)
MCHC: 34.1 g/dL (ref 31.5–35.7)
MCV: 90 fL (ref 79–97)
Platelets: 132 10*3/uL — ABNORMAL LOW (ref 150–450)
RBC: 4.75 x10E6/uL (ref 4.14–5.80)
RDW: 14.1 % (ref 11.6–15.4)
WBC: 4.3 10*3/uL (ref 3.4–10.8)

## 2018-05-16 LAB — HEPATIC FUNCTION PANEL
ALT: 19 IU/L (ref 0–44)
AST: 18 IU/L (ref 0–40)
Albumin: 4.5 g/dL (ref 3.7–4.7)
Alkaline Phosphatase: 46 IU/L (ref 39–117)
Bilirubin Total: 0.6 mg/dL (ref 0.0–1.2)
Bilirubin, Direct: 0.16 mg/dL (ref 0.00–0.40)
Total Protein: 6.2 g/dL (ref 6.0–8.5)

## 2018-05-16 LAB — TSH: TSH: 3.64 u[IU]/mL (ref 0.450–4.500)

## 2018-06-06 DIAGNOSIS — K644 Residual hemorrhoidal skin tags: Secondary | ICD-10-CM | POA: Diagnosis not present

## 2018-06-06 DIAGNOSIS — J019 Acute sinusitis, unspecified: Secondary | ICD-10-CM | POA: Diagnosis not present

## 2018-06-27 DIAGNOSIS — N411 Chronic prostatitis: Secondary | ICD-10-CM | POA: Diagnosis not present

## 2018-06-27 DIAGNOSIS — N401 Enlarged prostate with lower urinary tract symptoms: Secondary | ICD-10-CM | POA: Diagnosis not present

## 2018-06-27 DIAGNOSIS — N3289 Other specified disorders of bladder: Secondary | ICD-10-CM | POA: Diagnosis not present

## 2018-07-12 DIAGNOSIS — L570 Actinic keratosis: Secondary | ICD-10-CM | POA: Diagnosis not present

## 2018-07-12 DIAGNOSIS — L821 Other seborrheic keratosis: Secondary | ICD-10-CM | POA: Diagnosis not present

## 2018-07-12 DIAGNOSIS — L918 Other hypertrophic disorders of the skin: Secondary | ICD-10-CM | POA: Diagnosis not present

## 2018-07-12 DIAGNOSIS — Z85820 Personal history of malignant melanoma of skin: Secondary | ICD-10-CM | POA: Diagnosis not present

## 2018-07-16 DIAGNOSIS — E785 Hyperlipidemia, unspecified: Secondary | ICD-10-CM | POA: Diagnosis not present

## 2018-07-16 DIAGNOSIS — K219 Gastro-esophageal reflux disease without esophagitis: Secondary | ICD-10-CM | POA: Diagnosis not present

## 2018-07-16 DIAGNOSIS — I10 Essential (primary) hypertension: Secondary | ICD-10-CM | POA: Diagnosis not present

## 2018-07-16 DIAGNOSIS — E039 Hypothyroidism, unspecified: Secondary | ICD-10-CM | POA: Diagnosis not present

## 2018-07-16 DIAGNOSIS — Z6830 Body mass index (BMI) 30.0-30.9, adult: Secondary | ICD-10-CM | POA: Diagnosis not present

## 2018-07-16 DIAGNOSIS — R7301 Impaired fasting glucose: Secondary | ICD-10-CM | POA: Diagnosis not present

## 2018-08-06 DIAGNOSIS — Z9181 History of falling: Secondary | ICD-10-CM | POA: Diagnosis not present

## 2018-08-06 DIAGNOSIS — J302 Other seasonal allergic rhinitis: Secondary | ICD-10-CM | POA: Diagnosis not present

## 2018-08-06 DIAGNOSIS — Z1331 Encounter for screening for depression: Secondary | ICD-10-CM | POA: Diagnosis not present

## 2018-08-15 DIAGNOSIS — J029 Acute pharyngitis, unspecified: Secondary | ICD-10-CM | POA: Diagnosis not present

## 2018-08-15 DIAGNOSIS — Z9181 History of falling: Secondary | ICD-10-CM | POA: Diagnosis not present

## 2018-08-15 DIAGNOSIS — Z1331 Encounter for screening for depression: Secondary | ICD-10-CM | POA: Diagnosis not present

## 2018-08-16 DIAGNOSIS — K219 Gastro-esophageal reflux disease without esophagitis: Secondary | ICD-10-CM | POA: Diagnosis not present

## 2018-08-16 DIAGNOSIS — E785 Hyperlipidemia, unspecified: Secondary | ICD-10-CM | POA: Diagnosis not present

## 2018-08-16 DIAGNOSIS — E039 Hypothyroidism, unspecified: Secondary | ICD-10-CM | POA: Diagnosis not present

## 2018-08-16 DIAGNOSIS — I10 Essential (primary) hypertension: Secondary | ICD-10-CM | POA: Diagnosis not present

## 2018-08-16 DIAGNOSIS — R7301 Impaired fasting glucose: Secondary | ICD-10-CM | POA: Diagnosis not present

## 2018-08-17 DIAGNOSIS — R7301 Impaired fasting glucose: Secondary | ICD-10-CM | POA: Diagnosis not present

## 2018-08-17 DIAGNOSIS — E039 Hypothyroidism, unspecified: Secondary | ICD-10-CM | POA: Diagnosis not present

## 2018-08-17 DIAGNOSIS — E785 Hyperlipidemia, unspecified: Secondary | ICD-10-CM | POA: Diagnosis not present

## 2018-09-05 ENCOUNTER — Ambulatory Visit: Payer: PPO | Admitting: Cardiology

## 2018-09-07 DIAGNOSIS — Z87898 Personal history of other specified conditions: Secondary | ICD-10-CM | POA: Diagnosis not present

## 2018-09-07 DIAGNOSIS — R070 Pain in throat: Secondary | ICD-10-CM | POA: Diagnosis not present

## 2018-09-07 DIAGNOSIS — K219 Gastro-esophageal reflux disease without esophagitis: Secondary | ICD-10-CM | POA: Diagnosis not present

## 2018-09-07 DIAGNOSIS — J342 Deviated nasal septum: Secondary | ICD-10-CM | POA: Diagnosis not present

## 2018-09-14 DIAGNOSIS — H35342 Macular cyst, hole, or pseudohole, left eye: Secondary | ICD-10-CM | POA: Diagnosis not present

## 2018-09-14 DIAGNOSIS — H25813 Combined forms of age-related cataract, bilateral: Secondary | ICD-10-CM | POA: Diagnosis not present

## 2018-09-18 DIAGNOSIS — N179 Acute kidney failure, unspecified: Secondary | ICD-10-CM | POA: Diagnosis not present

## 2018-10-10 DIAGNOSIS — N411 Chronic prostatitis: Secondary | ICD-10-CM | POA: Diagnosis not present

## 2018-10-10 DIAGNOSIS — N401 Enlarged prostate with lower urinary tract symptoms: Secondary | ICD-10-CM | POA: Diagnosis not present

## 2018-11-07 ENCOUNTER — Ambulatory Visit: Payer: PPO | Admitting: Cardiology

## 2018-11-15 DIAGNOSIS — S29011A Strain of muscle and tendon of front wall of thorax, initial encounter: Secondary | ICD-10-CM | POA: Diagnosis not present

## 2018-11-15 DIAGNOSIS — S29012A Strain of muscle and tendon of back wall of thorax, initial encounter: Secondary | ICD-10-CM | POA: Diagnosis not present

## 2018-11-21 ENCOUNTER — Encounter: Payer: Self-pay | Admitting: Cardiology

## 2018-11-21 ENCOUNTER — Other Ambulatory Visit: Payer: Self-pay

## 2018-11-21 ENCOUNTER — Ambulatory Visit (INDEPENDENT_AMBULATORY_CARE_PROVIDER_SITE_OTHER): Payer: PPO | Admitting: Cardiology

## 2018-11-21 VITALS — BP 122/62 | HR 80 | Ht 69.0 in | Wt 199.1 lb

## 2018-11-21 DIAGNOSIS — I2510 Atherosclerotic heart disease of native coronary artery without angina pectoris: Secondary | ICD-10-CM | POA: Diagnosis not present

## 2018-11-21 DIAGNOSIS — E785 Hyperlipidemia, unspecified: Secondary | ICD-10-CM

## 2018-11-21 DIAGNOSIS — I472 Ventricular tachycardia, unspecified: Secondary | ICD-10-CM

## 2018-11-21 DIAGNOSIS — N289 Disorder of kidney and ureter, unspecified: Secondary | ICD-10-CM | POA: Diagnosis not present

## 2018-11-21 DIAGNOSIS — I4729 Other ventricular tachycardia: Secondary | ICD-10-CM

## 2018-11-21 DIAGNOSIS — I10 Essential (primary) hypertension: Secondary | ICD-10-CM | POA: Diagnosis not present

## 2018-11-21 HISTORY — DX: Other ventricular tachycardia: I47.29

## 2018-11-21 HISTORY — DX: Ventricular tachycardia: I47.2

## 2018-11-21 NOTE — Addendum Note (Signed)
Addended by: Polly Cobia A on: 11/21/2018 10:46 AM   Modules accepted: Orders

## 2018-11-21 NOTE — Progress Notes (Signed)
Cardiology Office Note:    Date:  11/21/2018   ID:  Duane Hamilton, DOB January 01, 1946, MRN [id number]  PCP:  Nicoletta Dress, MD  Cardiologist:  Jenean Lindau, MD   Referring MD: Nicoletta Dress, MD    ASSESSMENT:    1. Coronary artery disease involving native coronary artery of native heart without angina pectoris   2. Essential hypertension   3. Dyslipidemia   4. Renal insufficiency, mild   5. Nonsustained ventricular tachycardia (HCC)    PLAN:    In order of problems listed above:  1. Nonsustained ventricular tachycardia: I discussed my findings with the patient at extensive length.  He will be back in the next few days for blood work including Chem-7, liver lipid check, TSH and CBC.  Patient is on a beta-blocker at an adequate dose.  He will undergo Lexiscan sestamibi to understand for any objective evidence of coronary artery disease which may be obstructive 2. Essential hypertension: Blood pressure stable 3. Mixed dyslipidemia: Diet was discussed patient is on medications and we will check blood work in the next few days. 4. Renal insufficiency is stable and we will have a Chem-7 check in the next few days 5. Patient will be seen in follow-up appointment in 6 months or earlier if the patient has any concerns    Medication Adjustments/Labs and Tests Ordered: Current medicines are reviewed at length with the patient today.  Concerns regarding medicines are outlined above.  No orders of the defined types were placed in this encounter.  No orders of the defined types were placed in this encounter.    No chief complaint on file.    History of Present Illness:    Duane Hamilton is a 73 y.o. male with past medical history of coronary artery disease, essential hypertension, dyslipidemia, renal insufficiency.  Patient was found to have nonsustained ventricular tachycardia on routine monitoring.  He denies any palpitations orthopnea or PND.  At the time of my  evaluation, the patient is alert awake oriented and in no distress.  Patient mentions to me that he walks on a regular basis but has not done so in the past few days because of a fall.  This was a mechanical fall.  Past Medical History:  Diagnosis Date  . Aortic stenosis   . Dyslipidemia 01/13/2015  . Esophageal stricture   . Essential hypertension 01/13/2015  . Hyperlipidemia   . Palpitations   . Renal insufficiency, mild 01/13/2015  . 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 Shortness of breath     History reviewed. No pertinent surgical history.  Current Medications: Current Meds  Medication Sig  . amoxicillin (AMOXIL) 500 MG tablet Take 4 tablets by mouth as directed.  Marland Kitchen aspirin EC 81 MG tablet Take 81 mg by mouth daily.   Marland Kitchen azelastine (ASTELIN) 0.1 % nasal spray Place 2 sprays into both nostrils as needed.   . cetirizine (ZYRTEC) 10 MG tablet TAKE 1 TABLET BY MOUTH AS NEEDED  . levothyroxine (SYNTHROID, LEVOTHROID) 75 MCG tablet Take 75 mcg by mouth daily before breakfast.   . lidocaine (XYLOCAINE) 4 % external solution TK 5 ML PO Q 4 TO 6 H PRN  . lisinopril (PRINIVIL,ZESTRIL) 2.5 MG tablet Take 1 tablet (2.5 mg total) by mouth daily.  . metoprolol succinate (TOPROL-XL) 50 MG 24 hr tablet Take 50 mg by mouth daily.   . Multiple Vitamins-Minerals (PRESERVISION AREDS PO) Take 1 tablet by mouth daily.  . pantoprazole (PROTONIX)  40 MG tablet Take 1 tablet by mouth daily.  . simvastatin (ZOCOR) 40 MG tablet Take 40 mg by mouth daily at 6 PM.   . tamsulosin (FLOMAX) 0.4 MG CAPS capsule Take 1 capsule by mouth daily.     Allergies:   Patient has no known allergies.   Social History   Socioeconomic History  . Marital status: Married    Spouse name: Not on file  . Number of children: Not on file  . Years of education: Not on file  . Highest education level: Not on file  Occupational History  . Not on file  Social Needs  . Financial resource strain: Not on file   . Food insecurity    Worry: Not on file    Inability: Not on file  . Transportation needs    Medical: Not on file    Non-medical: Not on file  Tobacco Use  . Smoking status: Never Smoker  . Smokeless tobacco: Never Used  Substance and Sexual Activity  . Alcohol use: No    Frequency: Never  . Drug use: No  . Sexual activity: Not on file  Lifestyle  . Physical activity    Days per week: Not on file    Minutes per session: Not on file  . Stress: Not on file  Relationships  . Social Herbalist on phone: Not on file    Gets together: Not on file    Attends religious service: Not on file    Active member of club or organization: Not on file    Attends meetings of clubs or organizations: Not on file    Relationship status: Not on file  Other Topics Concern  . Not on file  Social History Narrative  . Not on file     Family History: The patient's family history includes Atrial fibrillation in his father.  ROS:   Please see the history of present illness.    All other systems reviewed and are negative.  EKGs/Labs/Other Studies Reviewed:    The following studies were reviewed today: Study Highlights  Addendum by Jenean Lindau, MD on Mon May 14, 2018 4:07 PM  Duane Hamilton, DOB 04-19-1945, MRN [id number]  EVENT MONITOR REPORT:   Patient was monitored from 04/04/2018 to 04/18/2018.  Total monitoring was 13 days and 14 hours.. Indication:                    Palpitations Ordering physician:  Jenean Lindau, MD  Referring physician:        Jenean Lindau, MD    Baseline rhythm: Sinus rhythm  Minimum heart rate: 41 BPM.  Average heart rate: 68 BPM.  Maximal heart rate 154 BPM.  Atrial arrhythmia: Patient had atrial runs, longest lasting was 29 seconds at 93 bpm.  SVT.  Ventricular arrhythmia: One 7 beat nonsustained ventricular tachycardia at 130 bpm.  Conduction abnormality: None significant  Symptoms: None significant   Conclusion:   Abnormal event monitoring.  Atrial runs were seen.  One 7 beat nonsustained ventricular tachycardia was strongly documented.  Interpreting  cardiologist: Jenean Lindau, MD  Date: 05/14/2018 4:06 PM      Recent Labs: 05/15/2018: ALT 19; BUN 16; Creatinine, Ser 1.43; Hemoglobin 14.5; Platelets 132; Potassium 5.0; Sodium 135; TSH 3.640  Recent Lipid Panel    Component Value Date/Time   CHOL 161 05/15/2018 0859   TRIG 86 05/15/2018 0859   HDL 51 05/15/2018 0859   CHOLHDL  3.2 05/15/2018 0859   LDLCALC 93 05/15/2018 0859    Physical Exam:    VS:  BP 122/62   Pulse 80   Ht 5\' 9"  (1.753 m)   Wt 199 lb 1.9 oz (90.3 kg)   SpO2 99%   BMI 29.40 kg/m     Wt Readings from Last 3 Encounters:  11/21/18 199 lb 1.9 oz (90.3 kg)  03/12/18 205 lb (93 kg)  12/19/17 203 lb (92.1 kg)     GEN: Patient is in no acute distress HEENT: Normal NECK: No JVD; No carotid bruits LYMPHATICS: No lymphadenopathy CARDIAC: Hear sounds regular, 2/6 systolic murmur at the apex. RESPIRATORY:  Clear to auscultation without rales, wheezing or rhonchi  ABDOMEN: Soft, non-tender, non-distended MUSCULOSKELETAL:  No edema; No deformity  SKIN: Warm and dry NEUROLOGIC:  Alert and oriented x 3 PSYCHIATRIC:  Normal affect   Signed, Jenean Lindau, MD  11/21/2018 10:05 AM    Major

## 2018-11-21 NOTE — Patient Instructions (Signed)
Medication Instructions:  Your physician recommends that you continue on your current medications as directed. Please refer to the Current Medication list given to you today.  If you need a refill on your cardiac medications before your next appointment, please call your pharmacy.   Lab work: Chem 7, CBC, Liver Panel, Lipids, TSH If you have labs (blood work) drawn today and your tests are completely normal, you will receive your results only by: Marland Kitchen MyChart Message (if you have MyChart) OR . A paper copy in the mail If you have any lab test that is abnormal or we need to change your treatment, we will call you to review the results.  Testing/Procedures: LEXISCAN  Follow-Up: At [facility], you and your health needs are our priority.  As part of our continuing mission to provide you with exceptional heart care, we have created designated Provider Care Teams.  These Care Teams include your primary Cardiologist (physician) and Advanced Practice Providers (APPs -  Physician Assistants and Nurse Practitioners) who all work together to provide you with the care you need, when you need it. . You will need a follow up appointment in 6 months.  Please call our office 2 months in advance to schedule this appointment.    Any Other Special Instructions Will Be Listed Below (If Applicable).   Cardiac Nuclear Scan A cardiac nuclear scan is a test that measures blood flow to the heart when a person is resting and when he or she is exercising. The test looks for problems such as:  Not enough blood reaching a portion of the heart.  The heart muscle not working normally. You may need this test if:  You have heart disease.  You have had abnormal lab results.  You have had heart surgery or a balloon procedure to open up blocked arteries (angioplasty).  You have chest pain.  You have shortness of breath. In this test, a radioactive dye (tracer) is injected into your bloodstream. After the tracer  has traveled to your heart, an imaging device is used to measure how much of the tracer is absorbed by or distributed to various areas of your heart. This procedure is usually done at a hospital and takes 2-4 hours. Tell a health care provider about:  Any allergies you have.  All medicines you are taking, including vitamins, herbs, eye drops, creams, and over-the-counter medicines.  Any problems you or family members have had with anesthetic medicines.  Any blood disorders you have.  Any surgeries you have had.  Any medical conditions you have.  Whether you are pregnant or may be pregnant. What are the risks? Generally, this is a safe procedure. However, problems may occur, including:  Serious chest pain and heart attack. This is only a risk if the stress portion of the test is done.  Rapid heartbeat.  Sensation of warmth in your chest. This usually passes quickly.  Allergic reaction to the tracer. What happens before the procedure?  Ask your health care provider about changing or stopping your regular medicines. This is especially important if you are taking diabetes medicines or blood thinners.  Follow instructions from your health care provider about eating or drinking restrictions.  Remove your jewelry on the day of the procedure. What happens during the procedure?  An IV will be inserted into one of your veins.  Your health care provider will inject a small amount of radioactive tracer through the IV.  You will wait for 20-40 minutes while the tracer travels  through your bloodstream.  Your heart activity will be monitored with an electrocardiogram (ECG).  You will lie down on an exam table.  Images of your heart will be taken for about 15-20 minutes.  You may also have a stress test. For this test, one of the following may be done: ? You will exercise on a treadmill or stationary bike. While you exercise, your heart's activity will be monitored with an ECG, and  your blood pressure will be checked. ? You will be given medicines that will increase blood flow to parts of your heart. This is done if you are unable to exercise.  When blood flow to your heart has peaked, a tracer will again be injected through the IV.  After 20-40 minutes, you will get back on the exam table and have more images taken of your heart.  Depending on the type of tracer used, scans may need to be repeated 3-4 hours later.  Your IV line will be removed when the procedure is over. The procedure may vary among health care providers and hospitals. What happens after the procedure?  Unless your health care provider tells you otherwise, you may return to your normal schedule, including diet, activities, and medicines.  Unless your health care provider tells you otherwise, you may increase your fluid intake. This will help to flush the contrast dye from your body. Drink enough fluid to keep your urine pale yellow.  Ask your health care provider, or the department that is doing the test: ? When will my results be ready? ? How will I get my results? Summary  A cardiac nuclear scan measures the blood flow to the heart when a person is resting and when he or she is exercising.  Tell your health care provider if you are pregnant.  Before the procedure, ask your health care provider about changing or stopping your regular medicines. This is especially important if you are taking diabetes medicines or blood thinners.  After the procedure, unless your health care provider tells you otherwise, increase your fluid intake. This will help flush the contrast dye from your body.  After the procedure, unless your health care provider tells you otherwise, you may return to your normal schedule, including diet, activities, and medicines. This information is not intended to replace advice given to you by your health care provider. Make sure you discuss any questions you have with your health  care provider. Document Released: 04/29/2004 Document Revised: 09/18/2017 Document Reviewed: 09/18/2017 Elsevier Patient Education  2020 [employer].

## 2018-11-26 ENCOUNTER — Telehealth: Payer: Self-pay | Admitting: Cardiology

## 2018-11-26 DIAGNOSIS — I2510 Atherosclerotic heart disease of native coronary artery without angina pectoris: Secondary | ICD-10-CM | POA: Diagnosis not present

## 2018-11-26 DIAGNOSIS — I10 Essential (primary) hypertension: Secondary | ICD-10-CM | POA: Diagnosis not present

## 2018-11-26 DIAGNOSIS — N289 Disorder of kidney and ureter, unspecified: Secondary | ICD-10-CM | POA: Diagnosis not present

## 2018-11-26 DIAGNOSIS — E785 Hyperlipidemia, unspecified: Secondary | ICD-10-CM | POA: Diagnosis not present

## 2018-11-26 DIAGNOSIS — I472 Ventricular tachycardia: Secondary | ICD-10-CM | POA: Diagnosis not present

## 2018-11-26 NOTE — Telephone Encounter (Signed)
Pt states RRR ordered a lexiscan for him but he had one last august and does not think its necessary to repeat

## 2018-11-27 ENCOUNTER — Telehealth: Payer: Self-pay

## 2018-11-27 LAB — BASIC METABOLIC PANEL
BUN/Creatinine Ratio: 7 — ABNORMAL LOW (ref 10–24)
BUN: 10 mg/dL (ref 8–27)
CO2: 21 mmol/L (ref 20–29)
Calcium: 9.7 mg/dL (ref 8.6–10.2)
Chloride: 99 mmol/L (ref 96–106)
Creatinine, Ser: 1.39 mg/dL — ABNORMAL HIGH (ref 0.76–1.27)
GFR calc Af Amer: 58 mL/min/{1.73_m2} — ABNORMAL LOW (ref >59)
GFR calc non Af Amer: 50 mL/min/{1.73_m2} — ABNORMAL LOW (ref >59)
Glucose: 107 mg/dL — ABNORMAL HIGH (ref 65–99)
Potassium: 4.7 mmol/L (ref 3.5–5.2)
Sodium: 135 mmol/L (ref 134–144)

## 2018-11-27 LAB — HEPATIC FUNCTION PANEL
ALT: 13 IU/L (ref 0–44)
AST: 22 IU/L (ref 0–40)
Albumin: 4.6 g/dL (ref 3.7–4.7)
Alkaline Phosphatase: 45 IU/L (ref 39–117)
Bilirubin Total: 0.5 mg/dL (ref 0.0–1.2)
Bilirubin, Direct: 0.16 mg/dL (ref 0.00–0.40)
Total Protein: 6.3 g/dL (ref 6.0–8.5)

## 2018-11-27 LAB — CBC
Hematocrit: 41.3 % (ref 37.5–51.0)
Hemoglobin: 14.3 g/dL (ref 13.0–17.7)
MCH: 31.4 pg (ref 26.6–33.0)
MCHC: 34.6 g/dL (ref 31.5–35.7)
MCV: 91 fL (ref 79–97)
Platelets: 157 10*3/uL (ref 150–450)
RBC: 4.55 x10E6/uL (ref 4.14–5.80)
RDW: 12.9 % (ref 11.6–15.4)
WBC: 4.7 10*3/uL (ref 3.4–10.8)

## 2018-11-27 LAB — LIPID PANEL
Chol/HDL Ratio: 2.9 ratio (ref 0.0–5.0)
Cholesterol, Total: 116 mg/dL (ref 100–199)
HDL: 40 mg/dL (ref >39)
LDL Calculated: 62 mg/dL (ref 0–99)
Triglycerides: 70 mg/dL (ref 0–149)
VLDL Cholesterol Cal: 14 mg/dL (ref 5–40)

## 2018-11-27 LAB — TSH: TSH: 1.4 u[IU]/mL (ref 0.450–4.500)

## 2018-11-27 NOTE — Telephone Encounter (Signed)
-----   Message from Jenean Lindau, MD sent at 11/27/2018 10:54 AM EDT ----- The results of the study is unremarkable. Please inform patient. I will discuss in detail at next appointment. Cc  primary care/referring physician Jenean Lindau, MD 11/27/2018 10:54 AM

## 2018-11-27 NOTE — Telephone Encounter (Signed)
Results relayed to patient, copy sent to PCP and mailed to patient.

## 2018-12-03 NOTE — Telephone Encounter (Signed)
He needs it because of nonsustained ventricular tachycardia evaluation.  The previous one is too old if it was done in August last year.

## 2018-12-08 ENCOUNTER — Other Ambulatory Visit: Payer: Self-pay | Admitting: Cardiology

## 2018-12-27 ENCOUNTER — Telehealth (HOSPITAL_COMMUNITY): Payer: Self-pay | Admitting: *Deleted

## 2018-12-27 NOTE — Telephone Encounter (Signed)
Patient given detailed instructions per Myocardial Perfusion Study Information Sheet for the test on 01/01/19. Patient notified to arrive 15 minutes early and that it is imperative to arrive on time for appointment to keep from having the test rescheduled.  If you need to cancel or reschedule your appointment, please call the office within 24 hours of your appointment. . Patient verbalized understanding. Kirstie Peri

## 2019-01-01 ENCOUNTER — Other Ambulatory Visit: Payer: Self-pay

## 2019-01-01 ENCOUNTER — Ambulatory Visit (INDEPENDENT_AMBULATORY_CARE_PROVIDER_SITE_OTHER): Payer: PPO

## 2019-01-01 DIAGNOSIS — I472 Ventricular tachycardia: Secondary | ICD-10-CM

## 2019-01-01 MED ORDER — TECHNETIUM TC 99M TETROFOSMIN IV KIT
31.6000 | PACK | Freq: Once | INTRAVENOUS | Status: AC | PRN
  Administered 2019-01-01: 31.6 via INTRAVENOUS

## 2019-01-01 MED ORDER — TECHNETIUM TC 99M TETROFOSMIN IV KIT
10.0000 | PACK | Freq: Once | INTRAVENOUS | Status: AC | PRN
  Administered 2019-01-01: 10 via INTRAVENOUS

## 2019-01-01 MED ORDER — REGADENOSON 0.4 MG/5ML IV SOLN
0.4000 mg | Freq: Once | INTRAVENOUS | Status: AC
  Administered 2019-01-01: 0.4 mg via INTRAVENOUS

## 2019-01-02 DIAGNOSIS — R600 Localized edema: Secondary | ICD-10-CM | POA: Diagnosis not present

## 2019-01-03 LAB — MYOCARDIAL PERFUSION IMAGING
LV dias vol: 100 mL (ref 62–150)
LV sys vol: 30 mL
Peak HR: 113 {beats}/min
Rest HR: 61 {beats}/min
SDS: 2
SRS: 4
SSS: 6
TID: 1.09

## 2019-01-04 ENCOUNTER — Telehealth: Payer: Self-pay

## 2019-01-04 NOTE — Telephone Encounter (Signed)
-----   Message from Jenean Lindau, MD sent at 01/03/2019  3:00 PM EDT ----- Patient needs appointment to discuss results of this test. Jenean Lindau, MD 01/03/2019 2:59 PM

## 2019-01-04 NOTE — Telephone Encounter (Signed)
Information relayed to patient. He was told that front desk will call to get him a f/u with Dr. Harriet Masson in the next week to discuss lexi result. Note routed to L. Welch and A.Troutman.

## 2019-01-07 DIAGNOSIS — R351 Nocturia: Secondary | ICD-10-CM | POA: Diagnosis not present

## 2019-01-07 DIAGNOSIS — N401 Enlarged prostate with lower urinary tract symptoms: Secondary | ICD-10-CM | POA: Diagnosis not present

## 2019-01-14 ENCOUNTER — Telehealth: Payer: Self-pay | Admitting: Cardiology

## 2019-01-14 ENCOUNTER — Encounter: Payer: Self-pay | Admitting: *Deleted

## 2019-01-14 ENCOUNTER — Other Ambulatory Visit: Payer: Self-pay | Admitting: Cardiology

## 2019-01-14 ENCOUNTER — Encounter: Payer: Self-pay | Admitting: Cardiology

## 2019-01-14 ENCOUNTER — Ambulatory Visit (INDEPENDENT_AMBULATORY_CARE_PROVIDER_SITE_OTHER): Payer: PPO | Admitting: Cardiology

## 2019-01-14 ENCOUNTER — Other Ambulatory Visit: Payer: Self-pay

## 2019-01-14 VITALS — BP 130/70 | HR 73 | Ht 69.0 in | Wt 196.0 lb

## 2019-01-14 DIAGNOSIS — I2510 Atherosclerotic heart disease of native coronary artery without angina pectoris: Secondary | ICD-10-CM

## 2019-01-14 DIAGNOSIS — I10 Essential (primary) hypertension: Secondary | ICD-10-CM

## 2019-01-14 DIAGNOSIS — I472 Ventricular tachycardia: Secondary | ICD-10-CM

## 2019-01-14 DIAGNOSIS — I4729 Other ventricular tachycardia: Secondary | ICD-10-CM

## 2019-01-14 DIAGNOSIS — R9439 Abnormal result of other cardiovascular function study: Secondary | ICD-10-CM

## 2019-01-14 DIAGNOSIS — Z01812 Encounter for preprocedural laboratory examination: Secondary | ICD-10-CM

## 2019-01-14 NOTE — Telephone Encounter (Signed)
Wants cath scheduled for next week

## 2019-01-14 NOTE — Telephone Encounter (Signed)
Cardiac Cath scheduled per Dr. Harriet Masson for Monday Oct 5,2020 at 7:30 AM. Pt made aware of date and time and need to be at the Main Entrance at [facility] at 5:30 AM. Covid screen appointment made for Thurs Oct 1,2020 at 10:15AM at the [facility] testing site. Pt made aware of this appointment date and time also. Pre cath instructions gone over with patient verbally and instruction sheet printed for patient pick up tomorrow when he comes for his labs (CBC,BMP)in the morning. No further questions at this time.

## 2019-01-14 NOTE — Patient Instructions (Signed)
Medication Instructions:  Your physician recommends that you continue on your current medications as directed. Please refer to the Current Medication list given to you today.  If you need a refill on your cardiac medications before your next appointment, please call your pharmacy.   Lab work:None If you have labs (blood work) drawn today and your tests are completely normal, you will receive your results only by: Marland Kitchen MyChart Message (if you have MyChart) OR . A paper copy in the mail If you have any lab test that is abnormal or we need to change your treatment, we will call you to review the results.  Testing/Procedures: None  Follow-Up: At [facility], you and your health needs are our priority.  As part of our continuing mission to provide you with exceptional heart care, we have created designated Provider Care Teams.  These Care Teams include your primary Cardiologist (physician) and Advanced Practice Providers (APPs -  Physician Assistants and Nurse Practitioners) who all work together to provide you with the care you need, when you need it. You will need a follow up appointment in 1 months with Dr Geraldo Pitter Any Other Special Instructions Will Be Listed Below (If Applicable).

## 2019-01-14 NOTE — Progress Notes (Signed)
Cardiology Office Note:    Date:  01/14/2019   ID:  Duane Hamilton, DOB 07-Oct-1945, MRN [id number]  PCP:  Nicoletta Dress, MD  Cardiologist:  No primary care provider on file.  Electrophysiologist:  None   Referring MD: Nicoletta Dress, MD   Chief Complaint  Patient presents with  . Follow-up   ASSESSMENT:    1. Abnormal stress test   2. Nonsustained ventricular tachycardia ([place])   3. Essential hypertension   4. Coronary artery disease involving native coronary artery of native heart without angina pectoris    PLAN:    1.  The patient stress test was abnormal as he did have a reversible small mild defect in the basal inferior septum walls. In the setting of his abnormal stress test, his NSVT seen on the monitor it is appropriate to pursue diagnostic testing with left heart catheterization.  This was discussed with patient in detail.  The risk and benefit was explained to patient.  He understands that this is a small mild defect but with concomitant nonsustained ventricular tachycardia will be of benefit to rule out coronary artery disease. Right now he would prefer to call and schedule his left heart catheterization. He wants to speak with his family before making a commitment to testing and testing date.   For now he will remain off of his ACE inhibitors.   He was advised that if he develops any chest pain or shortness of breath to go to the nearest ED.   The patient is in agreement with the above plan. The patient left the office in stable condition.  The patient will follow up in 1 month with Dr. Geraldo Pitter  History of Present Illness:    Duane Hamilton is a 73 y.o. male with a hx of coronary artery disease, hypertension, dyslipidemia, renal insufficiency presents today to discuss abnormal stress test.  Patient does follow with Dr. Geraldo Pitter and was last seen on November 21, 2018.  At the time of his visit he was noted to have a 7 beat nonsustained ventricular tachycardia on  his long-term monitor.  Given his history it was recommended patient undergo pharmacologic stress test.  He is here today to discuss results.  Of note the patient tells me a day after his pharmacologic stress test he experienced some facial swelling including lip swelling.  He did see his primary care physician who treated him with Benadryl and steroids.  And has since been taken off lisinopril as his PCP suspected that lisinopril could have been a reason of his lip swelling.    Past Medical History:  Diagnosis Date  . Aortic stenosis   . Dyslipidemia 01/13/2015  . Esophageal stricture   . Essential hypertension 01/13/2015  . Hyperlipidemia   . Palpitations   . Renal insufficiency, mild 01/13/2015  . 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 Shortness of breath     No past surgical history on file.  Current Medications: Current Meds  Medication Sig  . amoxicillin (AMOXIL) 500 MG tablet Take 4 tablets by mouth as directed.  Marland Kitchen aspirin EC 81 MG tablet Take 81 mg by mouth daily.   Marland Kitchen azelastine (ASTELIN) 0.1 % nasal spray Place 2 sprays into both nostrils as needed.   . cetirizine (ZYRTEC) 10 MG tablet TAKE 1 TABLET BY MOUTH AS NEEDED  . levothyroxine (SYNTHROID, LEVOTHROID) 75 MCG tablet Take 75 mcg by mouth daily before breakfast.   . metoprolol succinate (TOPROL-XL) 50 MG 24 hr tablet  Take 50 mg by mouth daily.   . Multiple Vitamins-Minerals (PRESERVISION AREDS PO) Take 1 tablet by mouth daily.  . pantoprazole (PROTONIX) 40 MG tablet Take 1 tablet by mouth daily.  . simvastatin (ZOCOR) 40 MG tablet Take 40 mg by mouth daily at 6 PM.   . tamsulosin (FLOMAX) 0.4 MG CAPS capsule Take 1 capsule by mouth daily.     Allergies:   Lisinopril   Social History   Socioeconomic History  . Marital status: Married    Spouse name: Not on file  . Number of children: Not on file  . Years of education: Not on file  . Highest education level: Not on file  Occupational History  .  Not on file  Social Needs  . Financial resource strain: Not on file  . Food insecurity    Worry: Not on file    Inability: Not on file  . Transportation needs    Medical: Not on file    Non-medical: Not on file  Tobacco Use  . Smoking status: Never Smoker  . Smokeless tobacco: Never Used  Substance and Sexual Activity  . Alcohol use: No    Frequency: Never  . Drug use: No  . Sexual activity: Not on file  Lifestyle  . Physical activity    Days per week: Not on file    Minutes per session: Not on file  . Stress: Not on file  Relationships  . Social Herbalist on phone: Not on file    Gets together: Not on file    Attends religious service: Not on file    Active member of club or organization: Not on file    Attends meetings of clubs or organizations: Not on file    Relationship status: Not on file  Other Topics Concern  . Not on file  Social History Narrative  . Not on file     Family History: The patient's family history includes Atrial fibrillation in his father.  ROS:   Review of Systems  Constitution: Negative for decreased appetite, fever and weight gain.  HENT: Negative for congestion, ear discharge, hoarse voice and sore throat.   Eyes: Negative for discharge, redness, vision loss in right eye and visual halos.  Cardiovascular: Negative for chest pain, dyspnea on exertion, leg swelling, orthopnea and palpitations.  Respiratory: Negative for cough, hemoptysis, shortness of breath and snoring.   Endocrine: Negative for heat intolerance and polyphagia.  Hematologic/Lymphatic: Negative for bleeding problem. Does not bruise/bleed easily.  Skin: Negative for flushing, nail changes, rash and suspicious lesions.  Musculoskeletal: Negative for arthritis, joint pain, muscle cramps, myalgias, neck pain and stiffness.  Gastrointestinal: Negative for abdominal pain, bowel incontinence, diarrhea and excessive appetite.  Genitourinary: Negative for decreased libido,  genital sores and incomplete emptying.  Neurological: Negative for brief paralysis, focal weakness, headaches and loss of balance.  Psychiatric/Behavioral: Negative for altered mental status, depression and suicidal ideas.  Allergic/Immunologic: Negative for HIV exposure and persistent infections.    EKGs/Labs/Other Studies Reviewed:    The following studies were reviewed today:   EKG:  None performed today.  Longterm monitor Conclusion:  Abnormal event monitoring.  Atrial runs were seen.  One 7 beat nonsustained ventricular tachycardia was strongly documented.   Pharmacological stress test:  The left ventricular ejection fraction is hyperdynamic (>65%). Nuclear stress EF: 69%. There was no ST segment deviation noted during stress. No T wave inversion was noted during stress. Defect 1: There is a small defect  of mild severity present in the basal inferoseptal location. Defect 2: There is a small defect of mild severity present in the apical inferior and apical lateral location. Findings consistent with ischemia. This is an intermediate risk study.    Recent Labs: 11/26/2018: ALT 13; BUN 10; Creatinine, Ser 1.39; Hemoglobin 14.3; Platelets 157; Potassium 4.7; Sodium 135; TSH 1.400  Recent Lipid Panel    Component Value Date/Time   CHOL 116 11/26/2018 0819   TRIG 70 11/26/2018 0819   HDL 40 11/26/2018 0819   CHOLHDL 2.9 11/26/2018 0819   LDLCALC 62 11/26/2018 0819    Physical Exam:    VS:  BP 130/70 (BP Location: Right Arm, Patient Position: Sitting, Cuff Size: Normal)   Pulse 73   Ht 5\' 9"  (1.753 m)   Wt 196 lb (88.9 kg)   SpO2 95%   BMI 28.94 kg/m     Wt Readings from Last 3 Encounters:  01/14/19 196 lb (88.9 kg)  01/01/19 199 lb (90.3 kg)  11/21/18 199 lb 1.9 oz (90.3 kg)     GEN: Well nourished, well developed in no acute distress HEENT: Normal NECK: No JVD; No carotid bruits LYMPHATICS: No lymphadenopathy CARDIAC: S1S2 noted,RRR, 3/6 mid-ejection systolic  murmur, rubs, gallops RESPIRATORY:  Clear to auscultation without rales, wheezing or rhonchi  ABDOMEN: Soft, non-tender, non-distended, +bowel sounds, no guarding. EXTREMITIES: No edema, No cyanosis, no clubbing MUSCULOSKELETAL:  No edema; No deformity  SKIN: Warm and dry NEUROLOGIC:  Alert and oriented x 3, non-focal PSYCHIATRIC:  Normal affect, good insight     Medication Adjustments/Labs and Tests Ordered: Current medicines are reviewed at length with the patient today.  Concerns regarding medicines are outlined above.  No orders of the defined types were placed in this encounter.  No orders of the defined types were placed in this encounter.   Patient Instructions  Medication Instructions:  Your physician recommends that you continue on your current medications as directed. Please refer to the Current Medication list given to you today.  If you need a refill on your cardiac medications before your next appointment, please call your pharmacy.   Lab work:None If you have labs (blood work) drawn today and your tests are completely normal, you will receive your results only by: Marland Kitchen MyChart Message (if you have MyChart) OR . A paper copy in the mail If you have any lab test that is abnormal or we need to change your treatment, we will call you to review the results.  Testing/Procedures: None  Follow-Up: At [facility], you and your health needs are our priority.  As part of our continuing mission to provide you with exceptional heart care, we have created designated Provider Care Teams.  These Care Teams include your primary Cardiologist (physician) and Advanced Practice Providers (APPs -  Physician Assistants and Nurse Practitioners) who all work together to provide you with the care you need, when you need it. You will need a follow up appointment in 1 months with Dr Geraldo Pitter Any Other Special Instructions Will Be Listed Below (If Applicable).       Adopting a Healthy  Lifestyle.  Know what a healthy weight is for you (roughly BMI <25) and aim to maintain this   Aim for 7+ servings of fruits and vegetables daily   65-80+ fluid ounces of water or unsweet tea for healthy kidneys   Limit to max 1 drink of alcohol per day; avoid smoking/tobacco   Limit animal fats in diet for cholesterol and  heart health - choose grass fed whenever available   Avoid highly processed foods, and foods high in saturated/trans fats   Aim for low stress - take time to unwind and care for your mental health   Aim for 150 min of moderate intensity exercise weekly for heart health, and weights twice weekly for bone health   Aim for 7-9 hours of sleep daily   When it comes to diets, agreement about the perfect plan isnt easy to find, even among the experts. Experts at the [employer] developed an idea known as the Healthy Eating Plate. Just imagine a plate divided into logical, healthy portions.   The emphasis is on diet quality:   Load up on vegetables and fruits - one-half of your plate: Aim for color and variety, and remember that potatoes dont count.   Go for whole grains - one-quarter of your plate: Whole wheat, barley, wheat berries, quinoa, oats, brown rice, and foods made with them. If you want pasta, go with whole wheat pasta.   Protein power - one-quarter of your plate: Fish, chicken, beans, and nuts are all healthy, versatile protein sources. Limit red meat.   The diet, however, does go beyond the plate, offering a few other suggestions.   Use healthy plant oils, such as olive, canola, soy, corn, sunflower and peanut. Check the labels, and avoid partially hydrogenated oil, which have unhealthy trans fats.   If youre thirsty, drink water. Coffee and tea are good in moderation, but skip sugary drinks and limit milk and dairy products to one or two daily servings.   The type of carbohydrate in the diet is more important than the amount. Some  sources of carbohydrates, such as vegetables, fruits, whole grains, and beans-are healthier than others.   Finally, stay active  Signed, Berniece Salines, DO  01/14/2019 11:48 AM    [place]

## 2019-01-14 NOTE — Addendum Note (Signed)
Addended by: Particia Nearing B on: 01/14/2019 02:06 PM   Modules accepted: Orders

## 2019-01-15 DIAGNOSIS — Z01812 Encounter for preprocedural laboratory examination: Secondary | ICD-10-CM | POA: Diagnosis not present

## 2019-01-15 DIAGNOSIS — I2510 Atherosclerotic heart disease of native coronary artery without angina pectoris: Secondary | ICD-10-CM | POA: Diagnosis not present

## 2019-01-16 LAB — CBC
Hematocrit: 44.6 % (ref 37.5–51.0)
Hemoglobin: 15.3 g/dL (ref 13.0–17.7)
MCH: 30.6 pg (ref 26.6–33.0)
MCHC: 34.3 g/dL (ref 31.5–35.7)
MCV: 89 fL (ref 79–97)
Platelets: 137 10*3/uL — ABNORMAL LOW (ref 150–450)
RBC: 5 x10E6/uL (ref 4.14–5.80)
RDW: 13.3 % (ref 11.6–15.4)
WBC: 6.6 10*3/uL (ref 3.4–10.8)

## 2019-01-16 LAB — BASIC METABOLIC PANEL
BUN/Creatinine Ratio: 9 — ABNORMAL LOW (ref 10–24)
BUN: 14 mg/dL (ref 8–27)
CO2: 21 mmol/L (ref 20–29)
Calcium: 9.3 mg/dL (ref 8.6–10.2)
Chloride: 103 mmol/L (ref 96–106)
Creatinine, Ser: 1.5 mg/dL — ABNORMAL HIGH (ref 0.76–1.27)
GFR calc Af Amer: 53 mL/min/{1.73_m2} — ABNORMAL LOW (ref >59)
GFR calc non Af Amer: 46 mL/min/{1.73_m2} — ABNORMAL LOW (ref >59)
Glucose: 97 mg/dL (ref 65–99)
Potassium: 4.9 mmol/L (ref 3.5–5.2)
Sodium: 139 mmol/L (ref 134–144)

## 2019-01-17 ENCOUNTER — Other Ambulatory Visit (HOSPITAL_COMMUNITY)
Admission: RE | Admit: 2019-01-17 | Discharge: 2019-01-17 | Disposition: A | Discharge status: Home / Self Care | Payer: PPO | Source: Ambulatory Visit | Attending: Interventional Cardiology | Admitting: Interventional Cardiology

## 2019-01-17 ENCOUNTER — Telehealth: Payer: Self-pay | Admitting: *Deleted

## 2019-01-17 ENCOUNTER — Telehealth: Payer: Self-pay | Admitting: Cardiology

## 2019-01-17 DIAGNOSIS — E559 Vitamin D deficiency, unspecified: Secondary | ICD-10-CM | POA: Diagnosis not present

## 2019-01-17 DIAGNOSIS — R7301 Impaired fasting glucose: Secondary | ICD-10-CM | POA: Diagnosis not present

## 2019-01-17 DIAGNOSIS — I10 Essential (primary) hypertension: Secondary | ICD-10-CM | POA: Diagnosis not present

## 2019-01-17 DIAGNOSIS — E785 Hyperlipidemia, unspecified: Secondary | ICD-10-CM | POA: Diagnosis not present

## 2019-01-17 DIAGNOSIS — Z20828 Contact with and (suspected) exposure to other viral communicable diseases: Secondary | ICD-10-CM | POA: Insufficient documentation

## 2019-01-17 DIAGNOSIS — Z01812 Encounter for preprocedural laboratory examination: Secondary | ICD-10-CM | POA: Insufficient documentation

## 2019-01-17 DIAGNOSIS — Z6829 Body mass index (BMI) 29.0-29.9, adult: Secondary | ICD-10-CM | POA: Diagnosis not present

## 2019-01-17 DIAGNOSIS — K222 Esophageal obstruction: Secondary | ICD-10-CM | POA: Diagnosis not present

## 2019-01-17 DIAGNOSIS — E039 Hypothyroidism, unspecified: Secondary | ICD-10-CM | POA: Diagnosis not present

## 2019-01-17 DIAGNOSIS — K219 Gastro-esophageal reflux disease without esophagitis: Secondary | ICD-10-CM | POA: Diagnosis not present

## 2019-01-17 NOTE — Telephone Encounter (Signed)
I called patient to review his recent labs. His cr has worsened from 1.39 to 1.50.  I did educate the patient that going to cardiac catheterization could worsen his kidney function even more.  At this time the patient prefers to move forward with a cardiac catheterization especially in light of his abnormal stress test.  He prefers to know if he does have any coronary blockages.  With this I did encourage patient to drink enough fluids until his procedure on Monday at which time he will be hydrated pre-and post procedure.  He will return to the office 3 days after his procedure for repeat BMP and then 1 week later.  All his questions were answered to his satisfaction.

## 2019-01-17 NOTE — Telephone Encounter (Signed)
Pt contacted pre-catheterization scheduled at [facility] for: Monday January 21, 2019 7:30 AM Verified arrival time and place: [facility]) at: 5:30 AM    No solid food after midnight prior to cath, clear liquids until 5 AM day of procedure. Contrast allergy: no  AM meds can be  taken pre-cath with sip of water including: ASA 81 mg   Confirmed patient has responsible person to drive home post procedure and observe 24 hours after arriving home: yes  Currently, due to Covid-19 pandemic, only one support person will be allowed with patient. Must be the same support person for that patient's entire stay, will be screened and required to wear a mask. They will be asked to wait in the waiting room for the duration of the patient's stay.  Patients are required to wear a mask when they enter the hospital.      COVID-19 Pre-Screening Questions:  . In the past 7 to 10 days have you had a cough,  shortness of breath, headache, congestion, fever (100 or greater) body aches, chills, sore throat, or sudden loss of taste or sense of smell? no . Have you been around anyone with known Covid 19? no . Have you been around anyone who is awaiting Covid 19 test results in the past 7 to 10 days? no . Have you been around anyone who has been exposed to Covid 19, or has mentioned symptoms of Covid 19 within the past 7 to 10 days? no  I reviewed procedure/mask/visitor, Covid-19 screening questions with patient, he verbalized understanding, thanked me for call.

## 2019-01-18 ENCOUNTER — Other Ambulatory Visit (HOSPITAL_COMMUNITY): Payer: PPO

## 2019-01-18 LAB — NOVEL CORONAVIRUS, NAA (HOSP ORDER, SEND-OUT TO REF LAB; TAT 18-24 HRS): SARS-CoV-2, NAA: NOT DETECTED

## 2019-01-20 DIAGNOSIS — I350 Nonrheumatic aortic (valve) stenosis: Secondary | ICD-10-CM

## 2019-01-20 DIAGNOSIS — I4729 Other ventricular tachycardia: Secondary | ICD-10-CM

## 2019-01-20 DIAGNOSIS — I472 Ventricular tachycardia: Secondary | ICD-10-CM

## 2019-01-20 NOTE — H&P (Addendum)
   NSVT in telemetry  CKD 3  Abnomal nuclear  ? AS  H/O CAD

## 2019-01-21 ENCOUNTER — Encounter (HOSPITAL_COMMUNITY)
Admission: RE | Disposition: A | Discharge status: Home / Self Care | Payer: Self-pay | Source: Home / Self Care | Attending: Interventional Cardiology

## 2019-01-21 ENCOUNTER — Other Ambulatory Visit: Payer: Self-pay

## 2019-01-21 ENCOUNTER — Ambulatory Visit (HOSPITAL_COMMUNITY)
Admission: RE | Admit: 2019-01-21 | Discharge: 2019-01-21 | Disposition: A | Discharge status: Home / Self Care | Payer: PPO | Attending: Interventional Cardiology | Admitting: Interventional Cardiology

## 2019-01-21 DIAGNOSIS — Z79890 Hormone replacement therapy (postmenopausal): Secondary | ICD-10-CM | POA: Insufficient documentation

## 2019-01-21 DIAGNOSIS — Z79899 Other long term (current) drug therapy: Secondary | ICD-10-CM | POA: Diagnosis not present

## 2019-01-21 DIAGNOSIS — Z7982 Long term (current) use of aspirin: Secondary | ICD-10-CM | POA: Insufficient documentation

## 2019-01-21 DIAGNOSIS — R9439 Abnormal result of other cardiovascular function study: Secondary | ICD-10-CM

## 2019-01-21 DIAGNOSIS — I129 Hypertensive chronic kidney disease with stage 1 through stage 4 chronic kidney disease, or unspecified chronic kidney disease: Secondary | ICD-10-CM | POA: Insufficient documentation

## 2019-01-21 DIAGNOSIS — I472 Ventricular tachycardia: Secondary | ICD-10-CM | POA: Diagnosis not present

## 2019-01-21 DIAGNOSIS — I4729 Other ventricular tachycardia: Secondary | ICD-10-CM

## 2019-01-21 DIAGNOSIS — E785 Hyperlipidemia, unspecified: Secondary | ICD-10-CM | POA: Diagnosis present

## 2019-01-21 DIAGNOSIS — I2510 Atherosclerotic heart disease of native coronary artery without angina pectoris: Secondary | ICD-10-CM | POA: Diagnosis not present

## 2019-01-21 DIAGNOSIS — N289 Disorder of kidney and ureter, unspecified: Secondary | ICD-10-CM

## 2019-01-21 DIAGNOSIS — I350 Nonrheumatic aortic (valve) stenosis: Secondary | ICD-10-CM

## 2019-01-21 DIAGNOSIS — N183 Chronic kidney disease, stage 3 unspecified: Secondary | ICD-10-CM | POA: Insufficient documentation

## 2019-01-21 DIAGNOSIS — I10 Essential (primary) hypertension: Secondary | ICD-10-CM | POA: Diagnosis present

## 2019-01-21 HISTORY — PX: LEFT HEART CATH AND CORONARY ANGIOGRAPHY: CATH118249

## 2019-01-21 SURGERY — LEFT HEART CATH AND CORONARY ANGIOGRAPHY
Anesthesia: LOCAL

## 2019-01-21 MED ORDER — LIDOCAINE HCL (PF) 1 % IJ SOLN
INTRAMUSCULAR | Status: AC
  Filled 2019-01-21: qty 30

## 2019-01-21 MED ORDER — ASPIRIN 81 MG PO CHEW: 81.0000 mg | CHEWABLE_TABLET | Freq: Once | ORAL | Status: DC

## 2019-01-21 MED ORDER — ASPIRIN 81 MG PO CHEW: 81.0000 mg | CHEWABLE_TABLET | Freq: Every day | ORAL | Status: DC

## 2019-01-21 MED ORDER — ONDANSETRON HCL 4 MG/2ML IJ SOLN: 4.0000 mg | Freq: Four times a day (QID) | INTRAMUSCULAR | Status: DC | PRN

## 2019-01-21 MED ORDER — IOHEXOL 350 MG/ML SOLN
INTRAVENOUS | Status: DC | PRN
  Administered 2019-01-21: 11:00:00 80 mL

## 2019-01-21 MED ORDER — OXYCODONE HCL 5 MG PO TABS: 5.0000 mg | ORAL_TABLET | ORAL | Status: DC | PRN

## 2019-01-21 MED ORDER — MIDAZOLAM HCL 2 MG/2ML IJ SOLN
INTRAMUSCULAR | Status: AC
  Filled 2019-01-21: qty 2

## 2019-01-21 MED ORDER — ACETAMINOPHEN 325 MG PO TABS: 650.0000 mg | ORAL_TABLET | ORAL | Status: DC | PRN

## 2019-01-21 MED ORDER — MIDAZOLAM HCL 2 MG/2ML IJ SOLN
INTRAMUSCULAR | Status: DC | PRN
  Administered 2019-01-21 (×2): 1 mg via INTRAVENOUS

## 2019-01-21 MED ORDER — HEPARIN (PORCINE) IN NACL 1000-0.9 UT/500ML-% IV SOLN
INTRAVENOUS | Status: AC
  Filled 2019-01-21: qty 500

## 2019-01-21 MED ORDER — SODIUM CHLORIDE 0.9 % WEIGHT BASED INFUSION: 1.0000 mL/kg/h | INTRAVENOUS | Status: DC

## 2019-01-21 MED ORDER — LIDOCAINE HCL (PF) 1 % IJ SOLN
INTRAMUSCULAR | Status: DC | PRN
  Administered 2019-01-21: 2 mL

## 2019-01-21 MED ORDER — HEPARIN SODIUM (PORCINE) 1000 UNIT/ML IJ SOLN
INTRAMUSCULAR | Status: AC
  Filled 2019-01-21: qty 1

## 2019-01-21 MED ORDER — SODIUM CHLORIDE 0.9 % IV SOLN: 250.0000 mL | INTRAVENOUS | Status: DC | PRN

## 2019-01-21 MED ORDER — HYDRALAZINE HCL 20 MG/ML IJ SOLN: 10.0000 mg | INTRAMUSCULAR | Status: DC | PRN

## 2019-01-21 MED ORDER — HEPARIN SODIUM (PORCINE) 1000 UNIT/ML IJ SOLN
INTRAMUSCULAR | Status: DC | PRN
  Administered 2019-01-21: 4500 [IU] via INTRAVENOUS

## 2019-01-21 MED ORDER — FENTANYL CITRATE (PF) 100 MCG/2ML IJ SOLN
INTRAMUSCULAR | Status: DC | PRN
  Administered 2019-01-21: 25 ug via INTRAVENOUS

## 2019-01-21 MED ORDER — ASPIRIN 81 MG PO CHEW
CHEWABLE_TABLET | ORAL | Status: AC
  Administered 2019-01-21: 06:00:00 81 mg
  Filled 2019-01-21: qty 1

## 2019-01-21 MED ORDER — SODIUM CHLORIDE 0.9% FLUSH: 3.0000 mL | Freq: Two times a day (BID) | INTRAVENOUS | Status: DC

## 2019-01-21 MED ORDER — SODIUM CHLORIDE 0.9% FLUSH: 3.0000 mL | INTRAVENOUS | Status: DC | PRN

## 2019-01-21 MED ORDER — HEPARIN (PORCINE) IN NACL 1000-0.9 UT/500ML-% IV SOLN
INTRAVENOUS | Status: DC | PRN
  Administered 2019-01-21 (×2): 500 mL

## 2019-01-21 MED ORDER — SODIUM CHLORIDE 0.9 % IV SOLN: INTRAVENOUS | Status: DC

## 2019-01-21 MED ORDER — FENTANYL CITRATE (PF) 100 MCG/2ML IJ SOLN
INTRAMUSCULAR | Status: AC
  Filled 2019-01-21: qty 2

## 2019-01-21 MED ORDER — LABETALOL HCL 5 MG/ML IV SOLN: 10.0000 mg | INTRAVENOUS | Status: DC | PRN

## 2019-01-21 MED ORDER — SODIUM CHLORIDE 0.9 % WEIGHT BASED INFUSION
3.0000 mL/kg/h | INTRAVENOUS | Status: AC
  Administered 2019-01-21: 06:00:00 3 mL/kg/h via INTRAVENOUS

## 2019-01-21 MED ORDER — VERAPAMIL HCL 2.5 MG/ML IV SOLN
INTRAVENOUS | Status: AC
  Filled 2019-01-21: qty 2

## 2019-01-21 MED ORDER — VERAPAMIL HCL 2.5 MG/ML IV SOLN
INTRAVENOUS | Status: DC | PRN
  Administered 2019-01-21: 10:00:00 10 mL via INTRA_ARTERIAL

## 2019-01-21 SURGICAL SUPPLY — 9 items

## 2019-01-21 NOTE — Discharge Instructions (Signed)
Radial Site Care ° °This sheet gives you information about how to care for yourself after your procedure. Your health care provider may also give you more specific instructions. If you have problems or questions, contact your health care provider. °What can I expect after the procedure? °After the procedure, it is common to have: °· Bruising and tenderness at the catheter insertion area. °Follow these instructions at home: °Medicines °· Take over-the-counter and prescription medicines only as told by your health care provider. °Insertion site care °· Follow instructions from your health care provider about how to take care of your insertion site. Make sure you: °? Wash your hands with soap and water before you change your bandage (dressing). If soap and water are not available, use hand sanitizer. °? Change your dressing as told by your health care provider. °? Leave stitches (sutures), skin glue, or adhesive strips in place. These skin closures may need to stay in place for 2 weeks or longer. If adhesive strip edges start to loosen and curl up, you may trim the loose edges. Do not remove adhesive strips completely unless your health care provider tells you to do that. °· Check your insertion site every day for signs of infection. Check for: °? Redness, swelling, or pain. °? Fluid or blood. °? Pus or a bad smell. °? Warmth. °· Do not take baths, swim, or use a hot tub until your health care provider approves. °· You may shower 24-48 hours after the procedure, or as directed by your health care provider. °? Remove the dressing and gently wash the site with plain soap and water. °? Pat the area dry with a clean towel. °? Do not rub the site. That could cause bleeding. °· Do not apply powder or lotion to the site. °Activity ° °· For 24 hours after the procedure, or as directed by your health care provider: °? Do not flex or bend the affected arm. °? Do not push or pull heavy objects with the affected arm. °? Do not  drive yourself home from the hospital or clinic. You may drive 24 hours after the procedure unless your health care provider tells you not to. °? Do not operate machinery or power tools. °· Do not lift anything that is heavier than 10 lb (4.5 kg), or the limit that you are told, until your health care provider says that it is safe. °· Ask your health care provider when it is okay to: °? Return to work or school. °? Resume usual physical activities or sports. °? Resume sexual activity. °General instructions °· If the catheter site starts to bleed, raise your arm and put firm pressure on the site. If the bleeding does not stop, get help right away. This is a medical emergency. °· If you went home on the same day as your procedure, a responsible adult should be with you for the first 24 hours after you arrive home. °· Keep all follow-up visits as told by your health care provider. This is important. °Contact a health care provider if: °· You have a fever. °· You have redness, swelling, or yellow drainage around your insertion site. °Get help right away if: °· You have unusual pain at the radial site. °· The catheter insertion area swells very fast. °· The insertion area is bleeding, and the bleeding does not stop when you hold steady pressure on the area. °· Your arm or hand becomes pale, cool, tingly, or numb. °These symptoms may represent a serious problem   that is an emergency. Do not wait to see if the symptoms will go away. Get medical help right away. Call your local emergency services (911 in the [place] Do not drive yourself to the hospital. °Summary °· After the procedure, it is common to have bruising and tenderness at the site. °· Follow instructions from your health care provider about how to take care of your radial site wound. Check the wound every day for signs of infection. °· Do not lift anything that is heavier than 10 lb (4.5 kg), or the limit that you are told, until your health care provider says  that it is safe. °This information is not intended to replace advice given to you by your health care provider. Make sure you discuss any questions you have with your health care provider. °Document Released: 05/07/2010 Document Revised: 05/10/2017 Document Reviewed: 05/10/2017 °Elsevier Patient Education © 2020 [employer] ° °

## 2019-01-21 NOTE — Interval H&P Note (Signed)
Cath Lab Visit (complete for each Cath Lab visit)  Clinical Evaluation Leading to the Procedure:   ACS: No.  Non-ACS:    Anginal Classification: CCS III  Anti-ischemic medical therapy: Minimal Therapy (1 class of medications)  Non-Invasive Test Results: No non-invasive testing performed  Prior CABG: No previous CABG      History and Physical Interval Note:  01/21/2019 10:08 AM  Duane Hamilton  has presented today for surgery, with the diagnosis of abnormal stress test.  The various methods of treatment have been discussed with the patient and family. After consideration of risks, benefits and other options for treatment, the patient has consented to  Procedure(s): LEFT HEART CATH AND CORONARY ANGIOGRAPHY (N/A) as a surgical intervention.  The patient's history has been reviewed, patient examined, no change in status, stable for surgery.  I have reviewed the patient's chart and labs.  Questions were answered to the patient's satisfaction.     Belva Crome III

## 2019-01-21 NOTE — CV Procedure (Signed)
   History of nonsustained VT and abnormal myocardial perfusion imaging  Three-vessel coronary calcification  Coronary angiography via right radial artery using real-time vascular ultrasound.  Widely patent right coronary.  Widely patent circumflex with small branch of the first obtuse marginal with 75% stenosis  Normal left main  Heavily calcified LAD with segmental 50% stenosis in the mid vessel  Normal LV function and LVEDP  Recommend preventive therapy for three-vessel nonobstructive coronary disease

## 2019-01-22 ENCOUNTER — Emergency Department (HOSPITAL_COMMUNITY)
Admission: EM | Admit: 2019-01-22 | Discharge: 2019-01-23 | Disposition: A | Discharge status: Home / Self Care | Payer: PPO | Attending: Emergency Medicine | Admitting: Emergency Medicine

## 2019-01-22 ENCOUNTER — Encounter (HOSPITAL_COMMUNITY): Payer: Self-pay | Admitting: Interventional Cardiology

## 2019-01-22 DIAGNOSIS — Z79899 Other long term (current) drug therapy: Secondary | ICD-10-CM | POA: Diagnosis not present

## 2019-01-22 DIAGNOSIS — I2510 Atherosclerotic heart disease of native coronary artery without angina pectoris: Secondary | ICD-10-CM | POA: Insufficient documentation

## 2019-01-22 DIAGNOSIS — I10 Essential (primary) hypertension: Secondary | ICD-10-CM | POA: Diagnosis not present

## 2019-01-22 DIAGNOSIS — K5900 Constipation, unspecified: Secondary | ICD-10-CM | POA: Diagnosis not present

## 2019-01-22 DIAGNOSIS — M25519 Pain in unspecified shoulder: Secondary | ICD-10-CM | POA: Diagnosis not present

## 2019-01-22 DIAGNOSIS — Z7982 Long term (current) use of aspirin: Secondary | ICD-10-CM | POA: Insufficient documentation

## 2019-01-22 DIAGNOSIS — R072 Precordial pain: Secondary | ICD-10-CM | POA: Insufficient documentation

## 2019-01-22 DIAGNOSIS — R079 Chest pain, unspecified: Secondary | ICD-10-CM | POA: Diagnosis not present

## 2019-01-22 DIAGNOSIS — I700 Atherosclerosis of aorta: Secondary | ICD-10-CM | POA: Diagnosis not present

## 2019-01-22 LAB — CBC
HCT: 42.2 % (ref 39.0–52.0)
Hemoglobin: 14.9 g/dL (ref 13.0–17.0)
MCH: 30.6 pg (ref 26.0–34.0)
MCHC: 35.3 g/dL (ref 30.0–36.0)
MCV: 86.7 fL (ref 80.0–100.0)
Platelets: 141 10*3/uL — ABNORMAL LOW (ref 150–400)
RBC: 4.87 MIL/uL (ref 4.22–5.81)
RDW: 13 % (ref 11.5–15.5)
WBC: 12.2 10*3/uL — ABNORMAL HIGH (ref 4.0–10.5)
nRBC: 0 % (ref 0.0–0.2)

## 2019-01-22 LAB — BASIC METABOLIC PANEL
Anion gap: 11 (ref 5–15)
BUN: 12 mg/dL (ref 8–23)
CO2: 21 mmol/L — ABNORMAL LOW (ref 22–32)
Calcium: 9.2 mg/dL (ref 8.9–10.3)
Chloride: 92 mmol/L — ABNORMAL LOW (ref 98–111)
Creatinine, Ser: 1.29 mg/dL — ABNORMAL HIGH (ref 0.61–1.24)
GFR calc Af Amer: >60 mL/min (ref >60)
GFR calc non Af Amer: 55 mL/min — ABNORMAL LOW (ref >60)
Glucose, Bld: 112 mg/dL — ABNORMAL HIGH (ref 70–99)
Potassium: 4 mmol/L (ref 3.5–5.1)
Sodium: 124 mmol/L — ABNORMAL LOW (ref 135–145)

## 2019-01-22 LAB — URINALYSIS, ROUTINE W REFLEX MICROSCOPIC
Bilirubin Urine: NEGATIVE
Glucose, UA: NEGATIVE mg/dL
Hgb urine dipstick: NEGATIVE
Ketones, ur: NEGATIVE mg/dL
Leukocytes,Ua: NEGATIVE
Nitrite: NEGATIVE
Protein, ur: NEGATIVE mg/dL
Specific Gravity, Urine: 1.013 (ref 1.005–1.030)
pH: 6 (ref 5.0–8.0)

## 2019-01-22 LAB — TROPONIN I (HIGH SENSITIVITY): Troponin I (High Sensitivity): 56 ng/L — ABNORMAL HIGH (ref <18)

## 2019-01-22 LAB — LIPASE, BLOOD: Lipase: 39 U/L (ref 11–51)

## 2019-01-22 MED ORDER — SODIUM CHLORIDE 0.9% FLUSH
3.0000 mL | Freq: Once | INTRAVENOUS | Status: AC
  Administered 2019-01-23: 3 mL via INTRAVENOUS

## 2019-01-22 NOTE — ED Triage Notes (Signed)
Per EMS pt called out for chest pain that moves to his back and lower abdominal pain.  Pt had a heart cath yesterday and was told "his heart is all clean."  Today he went to stand up and "felt like a sledge hammer hit him in his chest" that pain has stopped.  He also c/o constipation causing lower abdominal cramping.  States he only has "water no solid".

## 2019-01-23 ENCOUNTER — Emergency Department (HOSPITAL_COMMUNITY): Payer: PPO

## 2019-01-23 DIAGNOSIS — R079 Chest pain, unspecified: Secondary | ICD-10-CM | POA: Diagnosis not present

## 2019-01-23 DIAGNOSIS — I700 Atherosclerosis of aorta: Secondary | ICD-10-CM | POA: Diagnosis not present

## 2019-01-23 LAB — TROPONIN I (HIGH SENSITIVITY)
Troponin I (High Sensitivity): 73 ng/L — ABNORMAL HIGH (ref <18)
Troponin I (High Sensitivity): 75 ng/L — ABNORMAL HIGH (ref <18)
Troponin I (High Sensitivity): 98 ng/L — ABNORMAL HIGH (ref <18)

## 2019-01-23 LAB — CBG MONITORING, ED: Glucose-Capillary: 108 mg/dL — ABNORMAL HIGH (ref 70–99)

## 2019-01-23 MED ORDER — IOHEXOL 350 MG/ML SOLN
100.0000 mL | Freq: Once | INTRAVENOUS | Status: AC | PRN
  Administered 2019-01-23: 100 mL via INTRAVENOUS

## 2019-01-23 MED ORDER — SODIUM CHLORIDE 0.9 % IV BOLUS
1000.0000 mL | Freq: Once | INTRAVENOUS | Status: AC
  Administered 2019-01-23: 02:00:00 1000 mL via INTRAVENOUS

## 2019-01-23 NOTE — ED Notes (Addendum)
No chest pain at this time, Dr Dina Rich in the room.

## 2019-01-23 NOTE — ED Provider Notes (Signed)
[facility] EMERGENCY DEPARTMENT Provider Note   CSN: [id number] Arrival date & time: 01/22/19  2100     History   Chief Complaint Chief Complaint  Patient presents with  . Chest Pain  . Back Pain    HPI Duane Hamilton is a 73 y.o. male.     HPI  This is a 73 year old male with history of aortic stenosis, hypertension, hyperlipidemia, coronary artery disease who presents with chest pain.  Patient reports that he had a cardiac catheterization yesterday.  Per chart review, patient with moderate nonobstructive disease.  No intervention was performed.  Patient reports that he was at home yesterday evening when he had acute onset of anterior chest pain radiated to the back.  He states that the anterior portion felt like a fluttering but the back pain was very sharp and intense.  This lasted for approximately 2 hours.  He reports that he was getting up to go the restroom at that time.  Denies any shortness of breath or fevers, or cough.  Currently he is pain-free.  Past Medical History:  Diagnosis Date  . Aortic stenosis   . Dyslipidemia 01/13/2015  . Esophageal stricture   . Essential hypertension 01/13/2015  . Hyperlipidemia   . Palpitations   . Renal insufficiency, mild 01/13/2015  . 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 Shortness of breath     Patient Active Problem List   Diagnosis Date Noted  . Aortic stenosis 01/20/2019  . 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 CAD (coronary artery disease) 12/05/2017  . 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 Dyslipidemia 01/13/2015  . Essential hypertension 01/13/2015  . Renal insufficiency, mild 01/13/2015    Past Surgical History:  Procedure Laterality Date  . LEFT HEART CATH AND CORONARY ANGIOGRAPHY N/A 01/21/2019   Procedure: LEFT HEART CATH AND CORONARY ANGIOGRAPHY;  Surgeon: Belva Crome, MD;  Location: [facility];  Service: Cardiovascular;   Laterality: N/A;        Home Medications    Prior to Admission medications   Medication Sig Start Date End Date Taking? Authorizing Provider  amoxicillin (AMOXIL) 500 MG tablet Take 4 tablets by mouth as directed. Dental procedures 11/08/18  Yes [provider]  aspirin EC 81 MG tablet Take 81 mg by mouth daily.    Yes [provider]  azelastine (ASTELIN) 0.1 % nasal spray Place 2 sprays into both nostrils daily as needed for rhinitis or allergies.    Yes [provider]  levothyroxine (SYNTHROID, LEVOTHROID) 75 MCG tablet Take 75 mcg by mouth daily before breakfast.    Yes [provider]  metoprolol succinate (TOPROL-XL) 50 MG 24 hr tablet Take 50 mg by mouth daily.    Yes [provider]  pantoprazole (PROTONIX) 40 MG tablet Take 40 mg by mouth daily.  10/25/17  Yes [provider]  simvastatin (ZOCOR) 40 MG tablet Take 40 mg by mouth daily at 6 PM.    Yes [provider]  tamsulosin (FLOMAX) 0.4 MG CAPS capsule Take 0.4 mg by mouth daily.  04/12/17  Yes [provider]    Family History Family History  Problem Relation Age of Onset  . Atrial fibrillation Father     Social History Social History   Tobacco Use  . Smoking status: Never Smoker  . Smokeless tobacco: Never Used  Substance Use Topics  . Alcohol use: No    Frequency: Never  . Drug use: No  Allergies   Lisinopril   Review of Systems Review of Systems  Constitutional: Negative for fever.  Respiratory: Negative for shortness of breath.   Cardiovascular: Positive for chest pain.  Gastrointestinal: Negative for abdominal pain, nausea and vomiting.  Musculoskeletal: Positive for back pain.  Neurological: Negative for weakness and numbness.  All other systems reviewed and are negative.    Physical Exam Updated Vital Signs BP 116/62   Pulse 60   Temp 98.2 F (36.8 C) (Oral)   Resp 14   Ht 1.753 m (5\' 9" )   Wt 86.6 kg   SpO2  97%   BMI 28.21 kg/m   Physical Exam Vitals signs and nursing note reviewed.  Constitutional:      Appearance: He is well-developed. He is not ill-appearing.  HENT:     Head: Normocephalic and atraumatic.  Eyes:     Pupils: Pupils are equal, round, and reactive to light.  Neck:     Musculoskeletal: Neck supple.  Cardiovascular:     Rate and Rhythm: Normal rate and regular rhythm.     Heart sounds: Normal heart sounds. No murmur.  Pulmonary:     Effort: Pulmonary effort is normal. No respiratory distress.     Breath sounds: Normal breath sounds. No wheezing.  Abdominal:     General: Bowel sounds are normal.     Palpations: Abdomen is soft.     Tenderness: There is no abdominal tenderness. There is no rebound.  Musculoskeletal:     Right lower leg: He exhibits no tenderness. No edema.     Left lower leg: He exhibits no tenderness. No edema.  Skin:    General: Skin is warm and dry.  Neurological:     Mental Status: He is alert and oriented to person, place, and time.  Psychiatric:        Mood and Affect: Mood normal.      ED Treatments / Results  Labs (all labs ordered are listed, but only abnormal results are displayed) Labs Reviewed  BASIC METABOLIC PANEL - Abnormal; Notable for the following components:      Result Value   Sodium 124 (*)    Chloride 92 (*)    CO2 21 (*)    Glucose, Bld 112 (*)    Creatinine, Ser 1.29 (*)    GFR calc non Af Amer 55 (*)    All other components within normal limits  CBC - Abnormal; Notable for the following components:   WBC 12.2 (*)    Platelets 141 (*)    All other components within normal limits  TROPONIN I (HIGH SENSITIVITY) - Abnormal; Notable for the following components:   Troponin I (High Sensitivity) 56 (*)    All other components within normal limits  TROPONIN I (HIGH SENSITIVITY) - Abnormal; Notable for the following components:   Troponin I (High Sensitivity) 98 (*)    All other components within normal limits   TROPONIN I (HIGH SENSITIVITY) - Abnormal; Notable for the following components:   Troponin I (High Sensitivity) 75 (*)    All other components within normal limits  TROPONIN I (HIGH SENSITIVITY) - Abnormal; Notable for the following components:   Troponin I (High Sensitivity) 73 (*)    All other components within normal limits  LIPASE, BLOOD  URINALYSIS, ROUTINE W REFLEX MICROSCOPIC    EKG EKG Interpretation  Date/Time:  Tuesday January 22 2019 21:13:01 EDT Ventricular Rate:  89 PR Interval:  150 QRS Duration: 84 QT Interval:  338 QTC  Calculation: 411 R Axis:   23 Text Interpretation:  Sinus rhythm with Premature atrial complexes with Abberant conduction Otherwise normal ECG Confirmed by Thayer Jew [phone]) on 01/23/2019 1:44:03 AM   Radiology Dg Chest Portable 1 View  Result Date: 01/23/2019 CLINICAL DATA:  Chest pain EXAM: PORTABLE CHEST 1 VIEW COMPARISON:  Tomasita Crumble twenty-third 2019 FINDINGS: The heart size and mediastinal contours are within normal limits. Aortic knob calcifications. Both lungs are clear. The visualized skeletal structures are unremarkable. IMPRESSION: No acute cardiopulmonary process. Electronically Signed   By: Prudencio Pair M.D.   On: 01/23/2019 02:07   Ct Angio Chest/abd/pel For Dissection W And/or Wo Contrast  Result Date: 01/23/2019 CLINICAL DATA:  Chest pain EXAM: CT ANGIOGRAPHY CHEST, ABDOMEN AND PELVIS TECHNIQUE: Multidetector CT imaging through the chest, abdomen and pelvis was performed using the standard protocol during bolus administration of intravenous contrast. Multiplanar reconstructed images and MIPs were obtained and reviewed to evaluate the vascular anatomy. CONTRAST:  196mL OMNIPAQUE IOHEXOL 350 MG/ML SOLN COMPARISON:  Chest x-ray 01/23/2019, CT 12/03/2017, 04/29/2014 FINDINGS: CTA CHEST FINDINGS Cardiovascular: Non contrasted images of the chest demonstrate no acute intramural hematoma. Moderate aortic atherosclerosis. No aneurysmal  dilatation. No dissection is seen. No central filling defects within the pulmonary vasculature. Coronary vascular calcification. Normal heart size. No pericardial effusion. Mediastinum/Nodes: Midline trachea. No thyroid mass. No significantly enlarged lymph nodes. Esophagus within normal limits. Lungs/Pleura: No acute consolidation, pleural effusion or pneumothorax. Stable small foci of scarring at the left apex and subpleural left upper lobe. Musculoskeletal: No acute osseous abnormality. Degenerative changes of the spine. Review of the MIP images confirms the above findings. CTA ABDOMEN AND PELVIS FINDINGS VASCULAR Aorta: Nonaneurysmal. Moderate aortic atherosclerosis. No dissection seen. No acute occlusive disease. Celiac: Bulky calcification at the origin may result in mild narrowing. Widely patent distal vessel. SMA: Mild atherosclerosis at the origin. No significant stenosis. Distal vessel patency Renals: Single right and single left renal arteries. Mild calcifications at the origin, but no high-grade stenosis. IMA: Patent without evidence of aneurysm, dissection, vasculitis or significant stenosis. Inflow: Patent without evidence of aneurysm, dissection, vasculitis or significant stenosis. Moderate aortic atherosclerosis. Veins: No obvious venous abnormality within the limitations of this arterial phase study. Review of the MIP images confirms the above findings. NON-VASCULAR Hepatobiliary: No focal liver abnormality is seen. No gallstones, gallbladder wall thickening, or biliary dilatation. Pancreas: Unremarkable. No pancreatic ductal dilatation or surrounding inflammatory changes. Spleen: Normal in size without focal abnormality. Adrenals/Urinary Tract: Adrenal glands are unremarkable. Kidneys are normal, without renal calculi, focal lesion, or hydronephrosis. Bladder is unremarkable. Stomach/Bowel: Stomach is within normal limits. Appendix appears normal. No evidence of bowel wall thickening, distention,  or inflammatory changes. Prominent perirectal vascular enhancement. Lymphatic: No significantly enlarged lymph nodes Reproductive: Slightly enlarged prostate. Other: Negative for free air or free fluid. Minimal presacral edema/stranding. Musculoskeletal: No acute osseous abnormality. Suspicion of cortical bone thickening in the pelvis. Prominent facet sclerotic disease, chronic findings. Review of the MIP images confirms the above findings. IMPRESSION: 1. Negative for acute aortic dissection or aneurysm. 2. Atherosclerotic vascular disease of the aorta and branch vessels but without acute occlusive disease or high-grade stenosis 3. No CT evidence for acute intra-abdominal or pelvic abnormality. Electronically Signed   By: Donavan Foil M.D.   On: 01/23/2019 03:41    Procedures Procedures (including critical care time)  Medications Ordered in ED Medications  sodium chloride flush (NS) 0.9 % injection 3 mL (3 mLs Intravenous Given 01/23/19 0227)  sodium chloride 0.9 %  bolus 1,000 mL (0 mLs Intravenous Stopped 01/23/19 0332)  iohexol (OMNIPAQUE) 350 MG/ML injection 100 mL (100 mLs Intravenous Contrast Given 01/23/19 0243)     Initial Impression / Assessment and Plan / ED Course  I have reviewed the triage vital signs and the nursing notes.  Pertinent labs & imaging results that were available during my care of the patient were reviewed by me and considered in my medical decision making (see chart for details).  Clinical Course as of Jan 23 732  Wed Jan 23, 2019  [id number] CT negative for dissection.  Patient remains pain-free.  Spoke with Dr. Aundra Dubin regarding uptrending troponin.  Recommends repeating one more time to ensure no significant increase.   [CH]    Clinical Course User Index [CH] Keosha Rossa, Barbette Hair, MD       Presents with chest pain.  He is overall nontoxic-appearing vital signs are reassuring.  EKG shows no evidence of acute ischemia.  He had a cardiac catheterization yesterday that  showed nonobstructive disease.  One concerning feature of his pain is the radiation to the back and the sharp nature of the back.  While he says he is improved, dissection is certainly a consideration.  His initial troponin is elevated at 56 and increased to 98.  Suspect there may be some elevation related to recent catheterization.  CT scan obtained and ruled out dissection.  No evidence of pneumothorax or pneumonia.  See discussion with Dr. Aundra Dubin above.  Repeat troponin is downtrending.  This is reassuring.  Will discharge home and have him follow-up with cardiology.  He has been chest pain-free while in the ER.  After history, exam, and medical workup I feel the patient has been appropriately medically screened and is safe for discharge home. Pertinent diagnoses were discussed with the patient. Patient was given return precautions.   Final Clinical Impressions(s) / ED Diagnoses   Final diagnoses:  Precordial pain    ED Discharge Orders    None       Colbey Wirtanen, Barbette Hair, MD 01/23/19 [phone]

## 2019-01-23 NOTE — Discharge Instructions (Addendum)
You were seen today for chest pain.  Your numbers are now downtrending.  Your work-up is otherwise reassuring.  Follow-up with cardiology as an outpatient.

## 2019-01-23 NOTE — ED Notes (Signed)
Patient transported to CT 

## 2019-01-26 NOTE — Telephone Encounter (Signed)
Called patient to discuss his lab results.

## 2019-01-28 NOTE — Telephone Encounter (Signed)
Patient has called back and wants to discuss some things.Marland Kitchen

## 2019-01-29 DIAGNOSIS — L570 Actinic keratosis: Secondary | ICD-10-CM | POA: Diagnosis not present

## 2019-01-29 DIAGNOSIS — Z85820 Personal history of malignant melanoma of skin: Secondary | ICD-10-CM | POA: Diagnosis not present

## 2019-01-29 DIAGNOSIS — L821 Other seborrheic keratosis: Secondary | ICD-10-CM | POA: Diagnosis not present

## 2019-01-29 NOTE — Telephone Encounter (Signed)
Called x2   [phone]

## 2019-01-30 ENCOUNTER — Telehealth: Payer: Self-pay

## 2019-01-30 ENCOUNTER — Other Ambulatory Visit: Payer: Self-pay

## 2019-01-30 NOTE — Telephone Encounter (Signed)
No mention of aortic stenosis in the chart.  Cardiac catheterization also did not indicate any aortic stenosis.  If the patient needs further clarification he may have an echocardiogram.  This is not absolutely necessary.

## 2019-01-30 NOTE — Telephone Encounter (Signed)
Patient states he has not spoken to anyone about his condition. He received f/u discharge instructions from Dr. Tamala Julian after cath but very unclear of his current heart condition. He saw diagnosis on mychart of aortic stenosis but was never told this by Dr.RRR that this was an issue or updated on what was found during cath. He plays golf and was never told if he needs to decrease his activities or f/u with Dr. Docia Furl. Questions routed to Dr. Docia Furl for advisement, patient will be available after 3 pm for f/u call.

## 2019-01-30 NOTE — Telephone Encounter (Signed)
Cell # does not have vm setup. RN called home # discussed with Darnelle Bos (DPR) that Dr. Docia Furl has not diagnosed patient with aortic stenosis and unclear where this information came from. Expressed that if more research is necessary call office with details.

## 2019-01-30 NOTE — Telephone Encounter (Signed)
Patient states he will continue to golf and walk with his active lifestyle. He is scheduled for f/u on Nov.4th and advised to call office if an earlier appt is warranted. No further questions at this time.

## 2019-02-18 DIAGNOSIS — Z136 Encounter for screening for cardiovascular disorders: Secondary | ICD-10-CM | POA: Diagnosis not present

## 2019-02-18 DIAGNOSIS — Z1331 Encounter for screening for depression: Secondary | ICD-10-CM | POA: Diagnosis not present

## 2019-02-18 DIAGNOSIS — Z125 Encounter for screening for malignant neoplasm of prostate: Secondary | ICD-10-CM | POA: Diagnosis not present

## 2019-02-18 DIAGNOSIS — Z0000 Encounter for general adult medical examination without abnormal findings: Secondary | ICD-10-CM | POA: Diagnosis not present

## 2019-02-18 DIAGNOSIS — Z139 Encounter for screening, unspecified: Secondary | ICD-10-CM | POA: Diagnosis not present

## 2019-02-18 DIAGNOSIS — E785 Hyperlipidemia, unspecified: Secondary | ICD-10-CM | POA: Diagnosis not present

## 2019-02-18 DIAGNOSIS — Z9181 History of falling: Secondary | ICD-10-CM | POA: Diagnosis not present

## 2019-02-20 ENCOUNTER — Ambulatory Visit (INDEPENDENT_AMBULATORY_CARE_PROVIDER_SITE_OTHER): Payer: PPO | Admitting: Cardiology

## 2019-02-20 ENCOUNTER — Other Ambulatory Visit: Payer: Self-pay

## 2019-02-20 ENCOUNTER — Encounter: Payer: Self-pay | Admitting: Cardiology

## 2019-02-20 VITALS — BP 134/72 | HR 71 | Ht 69.0 in | Wt 195.0 lb

## 2019-02-20 DIAGNOSIS — I472 Ventricular tachycardia: Secondary | ICD-10-CM | POA: Diagnosis not present

## 2019-02-20 DIAGNOSIS — I4729 Other ventricular tachycardia: Secondary | ICD-10-CM

## 2019-02-20 DIAGNOSIS — N289 Disorder of kidney and ureter, unspecified: Secondary | ICD-10-CM | POA: Diagnosis not present

## 2019-02-20 DIAGNOSIS — Z1329 Encounter for screening for other suspected endocrine disorder: Secondary | ICD-10-CM | POA: Diagnosis not present

## 2019-02-20 DIAGNOSIS — E785 Hyperlipidemia, unspecified: Secondary | ICD-10-CM | POA: Diagnosis not present

## 2019-02-20 DIAGNOSIS — I2510 Atherosclerotic heart disease of native coronary artery without angina pectoris: Secondary | ICD-10-CM

## 2019-02-20 DIAGNOSIS — I10 Essential (primary) hypertension: Secondary | ICD-10-CM | POA: Diagnosis not present

## 2019-02-20 LAB — BASIC METABOLIC PANEL
BUN/Creatinine Ratio: 9 — ABNORMAL LOW (ref 10–24)
BUN: 13 mg/dL (ref 8–27)
CO2: 22 mmol/L (ref 20–29)
Calcium: 9.6 mg/dL (ref 8.6–10.2)
Chloride: 98 mmol/L (ref 96–106)
Creatinine, Ser: 1.39 mg/dL — ABNORMAL HIGH (ref 0.76–1.27)
GFR calc Af Amer: 58 mL/min/{1.73_m2} — ABNORMAL LOW (ref >59)
GFR calc non Af Amer: 50 mL/min/{1.73_m2} — ABNORMAL LOW (ref >59)
Glucose: 104 mg/dL — ABNORMAL HIGH (ref 65–99)
Potassium: 4.7 mmol/L (ref 3.5–5.2)
Sodium: 134 mmol/L (ref 134–144)

## 2019-02-20 MED ORDER — RANOLAZINE ER 1000 MG PO TB12: ORAL_TABLET | ORAL | 4 refills | Status: DC

## 2019-02-20 NOTE — Patient Instructions (Signed)
Medication Instructions:  Your physician has recommended you make the following change in your medication:   START ranexa 500 mg (0.5 tablet) twice daily for 2 weeks then increase to 1000 mg (1 tablet) twice daily *If you need a refill on your cardiac medications before your next appointment, please call your pharmacy*  Lab Work: Your physician recommends that you return FASTING next week for BMP, CBC, TSH, hepatic and lipid to be drawn   If you have labs (blood work) drawn today and your tests are completely normal, you will receive your results only by: Marland Kitchen MyChart Message (if you have MyChart) OR . A paper copy in the mail If you have any lab test that is abnormal or we need to change your treatment, we will call you to review the results.  Testing/Procedures: NONE  Follow-Up: At [facility], you and your health needs are our priority.  As part of our continuing mission to provide you with exceptional heart care, we have created designated Provider Care Teams.  These Care Teams include your primary Cardiologist (physician) and Advanced Practice Providers (APPs -  Physician Assistants and Nurse Practitioners) who all work together to provide you with the care you need, when you need it.  Your next appointment:   3 months  The format for your next appointment:   In Person  Provider:   Jyl Heinz, MD  Other Instructions Ranolazine tablets, extended release What is this medicine? RANOLAZINE (ra NOE la zeen) is a heart medicine. It is used to treat chronic chest pain (angina). This medicine must be taken regularly. It will not relieve an acute episode of chest pain. This medicine may be used for other purposes; ask your health care provider or pharmacist if you have questions. COMMON BRAND NAME(S): Ranexa What should I tell my health care provider before I take this medicine? They need to know if you have any of these conditions:  heart disease  irregular  heartbeat  kidney disease  liver disease  low levels of potassium or magnesium in the blood  an unusual or allergic reaction to ranolazine, other medicines, foods, dyes, or preservatives  pregnant or trying to get pregnant  breast-feeding How should I use this medicine? Take this medicine by mouth with a glass of water. Follow the directions on the prescription label. Do not cut, crush, or chew this medicine. Take with or without food. Do not take this medication with grapefruit juice. Take your doses at regular intervals. Do not take your medicine more often then directed. Talk to your pediatrician regarding the use of this medicine in children. Special care may be needed. Overdosage: If you think you have taken too much of this medicine contact a poison control center or emergency room at once. NOTE: This medicine is only for you. Do not share this medicine with others. What if I miss a dose? If you miss a dose, take it as soon as you can. If it is almost time for your next dose, take only that dose. Do not take double or extra doses. What may interact with this medicine? Do not take this medicine with any of the following medications:  antivirals for HIV or AIDS  cerivastatin  certain antibiotics like chloramphenicol, clarithromycin, dalfopristin; quinupristin, isoniazid, rifabutin, rifampin, rifapentine  certain medicines used for cancer like imatinib, nilotinib  certain medicines for fungal infections like fluconazole, itraconazole, ketoconazole, posaconazole, voriconazole  certain medicines for irregular heart beat like dronedarone  certain medicines for seizures like carbamazepine,  fosphenytoin, oxcarbazepine, phenobarbital, phenytoin  cisapride  conivaptan  cyclosporine  grapefruit or grapefruit juice  lumacaftor; ivacaftor  nefazodone  pimozide  quinacrine  St John's wort  thioridazine This medicine may also interact with the following  medications:  alfuzosin  certain medicines for depression, anxiety, or psychotic disturbances like bupropion, citalopram, fluoxetine, fluphenazine, paroxetine, perphenazine, risperidone, sertraline, trifluoperazine  certain medicines for cholesterol like atorvastatin, lovastatin, simvastatin  certain medicines for stomach problems like octreotide, palonosetron, prochlorperazine  eplerenone  ergot alkaloids like dihydroergotamine, ergonovine, ergotamine, methylergonovine  metformin  nicardipine  other medicines that prolong the QT interval (cause an abnormal heart rhythm) like dofetilide, ziprasidone  sirolimus  tacrolimus This list may not describe all possible interactions. Give your health care provider a list of all the medicines, herbs, non-prescription drugs, or dietary supplements you use. Also tell them if you smoke, drink alcohol, or use illegal drugs. Some items may interact with your medicine. What should I watch for while using this medicine? Visit your doctor for regular check ups. Tell your doctor or healthcare professional if your symptoms do not start to get better or if they get worse. This medicine will not relieve an acute attack of angina or chest pain. This medicine can change your heart rhythm. Your health care provider may check your heart rhythm by ordering an electrocardiogram (ECG) while you are taking this medicine. You may get drowsy or dizzy. Do not drive, use machinery, or do anything that needs mental alertness until you know how this medicine affects you. Do not stand or sit up quickly, especially if you are an older patient. This reduces the risk of dizzy or fainting spells. Alcohol may interfere with the effect of this medicine. Avoid alcoholic drinks. If you are scheduled for any medical or dental procedure, tell your healthcare provider that you are taking this medicine. This medicine can interact with other medicines used during surgery. What side  effects may I notice from receiving this medicine? Side effects that you should report to your doctor or health care professional as soon as possible:  allergic reactions like skin rash, itching or hives, swelling of the face, lips, or tongue  breathing problems  changes in vision  fast, irregular or pounding heartbeat  feeling faint or lightheaded, falls  low or high blood pressure  numbness or tingling feelings  ringing in the ears  tremor or shakiness  slow heartbeat (fewer than 50 beats per minute)  swelling of the legs or feet Side effects that usually do not require medical attention (report to your doctor or health care professional if they continue or are bothersome):  constipation  drowsy  dry mouth  headache  nausea or vomiting  stomach upset This list may not describe all possible side effects. Call your doctor for medical advice about side effects. You may report side effects to FDA at 1-800-FDA-1088. Where should I keep my medicine? Keep out of the reach of children. Store at room temperature between 15 and 30 degrees C (59 and 86 degrees F). Throw away any unused medicine after the expiration date. NOTE: This sheet is a summary. It may not cover all possible information. If you have questions about this medicine, talk to your doctor, pharmacist, or health care provider.  2020 Elsevier/Gold Standard (2018-03-27 09:18:49)

## 2019-02-20 NOTE — Progress Notes (Signed)
Cardiology Office Note:    Date:  02/20/2019   ID:  Duane Hamilton, DOB 1945/09/29, MRN [id number]  PCP:  Nicoletta Dress, MD  Cardiologist:  Jenean Lindau, MD   Referring MD: Nicoletta Dress, MD    ASSESSMENT:    1. Coronary artery disease involving native coronary artery of native heart without angina pectoris   2. Essential hypertension   3. 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 Dyslipidemia   5. Renal insufficiency, mild    PLAN:    In order of problems listed above:  1. Coronary artery disease: This was stated to the patient.  Secondary prevention stressed.  Importance of compliance with diet and medication stressed and verbalized understanding.  Exercise protocol as outlined and he understood.  He promises to do this. 2. Essential hypertension: Blood pressure stable 3. Mixed dyslipidemia: Lipids were discussed and diet was discussed we will recheck this before his 3-month appointment. 4. Renal insufficiency: He will have a Chem-7 today as he has undergone coronary angiography. 5. Patient will be seen in follow-up appointment in 3 months or earlier if the patient has any concerns 6. Also because of his multiple nonobstructive coronary artery disease and history of nonsustained ventricular tachycardia I will initiate him on Ranexa 500 mg twice daily for 2 weeks and then a gram 3 times daily for subsequent.  Benefits and risks of the medications were discussed and he vocalized understanding and questions were answered to his satisfaction.   Medication Adjustments/Labs and Tests Ordered: Current medicines are reviewed at length with the patient today.  Concerns regarding medicines are outlined above.  No orders of the defined types were placed in this encounter.  No orders of the defined types were placed in this encounter.    No chief complaint on file.    History of Present Illness:    Duane Hamilton is a 73 y.o. male.  Patient has past medical  history of essential hypertension and dyslipidemia.  He underwent coronary angiography and the results are mentioned below.  Subsequently was discharged home.  He is now walking on a regular basis.  He has no symptoms.  No chest pain orthopnea or PND.  At the time of my evaluation, the patient is alert awake oriented and in no distress.  Past Medical History:  Diagnosis Date  . Aortic stenosis   . Dyslipidemia 01/13/2015  . Esophageal stricture   . Essential hypertension 01/13/2015  . Hyperlipidemia   . Palpitations   . Renal insufficiency, mild 01/13/2015  . 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 Shortness of breath     Past Surgical History:  Procedure Laterality Date  . LEFT HEART CATH AND CORONARY ANGIOGRAPHY N/A 01/21/2019   Procedure: LEFT HEART CATH AND CORONARY ANGIOGRAPHY;  Surgeon: Belva Crome, MD;  Location: [place] [facility];  Service: Cardiovascular;  Laterality: N/A;    Current Medications: Current Meds  Medication Sig  . amoxicillin (AMOXIL) 500 MG tablet Take 4 tablets by mouth as directed. Dental procedures  . aspirin EC 81 MG tablet Take 81 mg by mouth daily.   Marland Kitchen azelastine (ASTELIN) 0.1 % nasal spray Place 2 sprays into both nostrils daily as needed for rhinitis or allergies.   Marland Kitchen levothyroxine (SYNTHROID, LEVOTHROID) 75 MCG tablet Take 75 mcg by mouth daily before breakfast.   . metoprolol succinate (TOPROL-XL) 50 MG 24 hr tablet Take 50 mg by mouth daily.   . pantoprazole (PROTONIX) 40 MG tablet Take  40 mg by mouth daily.  . simvastatin (ZOCOR) 40 MG tablet Take 40 mg by mouth daily at 6 PM.   . tamsulosin (FLOMAX) 0.4 MG CAPS capsule Take 0.4 mg by mouth daily.      Allergies:   Lisinopril   Social History   Socioeconomic History  . Marital status: Married    Spouse name: Not on file  . Number of children: Not on file  . Years of education: Not on file  . Highest education level: Not on file  Occupational History  . Not on file   Social Needs  . Financial resource strain: Not on file  . Food insecurity    Worry: Not on file    Inability: Not on file  . Transportation needs    Medical: Not on file    Non-medical: Not on file  Tobacco Use  . Smoking status: Never Smoker  . Smokeless tobacco: Never Used  Substance and Sexual Activity  . Alcohol use: No    Frequency: Never  . Drug use: No  . Sexual activity: Not on file  Lifestyle  . Physical activity    Days per week: Not on file    Minutes per session: Not on file  . Stress: Not on file  Relationships  . Social Herbalist on phone: Not on file    Gets together: Not on file    Attends religious service: Not on file    Active member of club or organization: Not on file    Attends meetings of clubs or organizations: Not on file    Relationship status: Not on file  Other Topics Concern  . Not on file  Social History Narrative  . Not on file     Family History: The patient's family history includes Atrial fibrillation in his father.  ROS:   Please see the history of present illness.    All other systems reviewed and are negative.  EKGs/Labs/Other Studies Reviewed:    The following studies were reviewed today: Belva Crome, MD (Primary)    Procedures  LEFT HEART CATH AND CORONARY ANGIOGRAPHY  Conclusion   Moderate nonobstructive 65% mid LAD disease and 75% first diagonal (small) that arises from the diseased region.  LAD is heavily calcified.  Normal left main.  Circumflex is calcified and as noted above there is 60 to 70% ostial narrowing of the small first marginal.  Circumflex is otherwise widely patent.  RCA contains mid vessel ectasia.  Moderate calcification noted throughout the course of the RCA.  No obstructive disease is noted.  Normal LV function and hemodynamics  RECOMMENDATIONS:   Preventive therapy with aggressive statin lowering of LDL to less than 70, blood pressure control to less than 130/80 mmHg,  moderate aerobic activity (greater than 150 minutes/week), evaluation of glycemic control, and consideration of VASCEPA if TG is > 135.      Recent Labs: 11/26/2018: ALT 13; TSH 1.400 01/22/2019: BUN 12; Creatinine, Ser 1.29; Hemoglobin 14.9; Platelets 141; Potassium 4.0; Sodium 124  Recent Lipid Panel    Component Value Date/Time   CHOL 116 11/26/2018 0819   TRIG 70 11/26/2018 0819   HDL 40 11/26/2018 0819   CHOLHDL 2.9 11/26/2018 0819   LDLCALC 62 11/26/2018 0819    Physical Exam:    VS:  BP 134/72 (BP Location: Left Arm, Patient Position: Sitting, Cuff Size: Normal)   Pulse 71   Ht 5\' 9"  (1.753 m)   Wt 195 lb (88.5  kg)   SpO2 99%   BMI 28.80 kg/m     Wt Readings from Last 3 Encounters:  02/20/19 195 lb (88.5 kg)  01/22/19 191 lb (86.6 kg)  01/21/19 191 lb (86.6 kg)     GEN: Patient is in no acute distress HEENT: Normal NECK: No JVD; No carotid bruits LYMPHATICS: No lymphadenopathy CARDIAC: Hear sounds regular, 2/6 systolic murmur at the apex. RESPIRATORY:  Clear to auscultation without rales, wheezing or rhonchi  ABDOMEN: Soft, non-tender, non-distended MUSCULOSKELETAL:  No edema; No deformity  SKIN: Warm and dry NEUROLOGIC:  Alert and oriented x 3 PSYCHIATRIC:  Normal affect   Signed, Jenean Lindau, MD  02/20/2019 9:20 AM    [place]

## 2019-02-25 ENCOUNTER — Telehealth: Payer: Self-pay | Admitting: Cardiology

## 2019-02-25 MED ORDER — RANOLAZINE ER 500 MG PO TB12: ORAL_TABLET | ORAL | 0 refills | Status: DC

## 2019-02-25 MED ORDER — RANOLAZINE ER 1000 MG PO TB12: ORAL_TABLET | ORAL | 4 refills | Status: DC

## 2019-02-25 NOTE — Telephone Encounter (Signed)
Has question about prescription that was sent in

## 2019-02-25 NOTE — Addendum Note (Signed)
Addended by: Beckey Rutter on: 02/25/2019 04:40 PM   Modules accepted: Orders

## 2019-02-25 NOTE — Telephone Encounter (Signed)
Spoke with patient and sent ranexa script to [facility] for price comparison.

## 2019-02-25 NOTE — Addendum Note (Signed)
Addended by: Beckey Rutter on: 02/25/2019 09:40 AM   Modules accepted: Orders

## 2019-02-25 NOTE — Telephone Encounter (Signed)
Left message for patient to call back with questions.

## 2019-02-27 ENCOUNTER — Telehealth: Payer: Self-pay

## 2019-02-27 DIAGNOSIS — H353131 Nonexudative age-related macular degeneration, bilateral, early dry stage: Secondary | ICD-10-CM | POA: Diagnosis not present

## 2019-02-27 NOTE — Telephone Encounter (Signed)
Results relayed, copy sent to Dr. Schultz 

## 2019-02-27 NOTE — Telephone Encounter (Signed)
-----   Message from Rajan R Revankar, MD sent at 02/20/2019  5:00 PM EST ----- The results of the study is unremarkable. Please inform patient. I will discuss in detail at next appointment. Cc  primary care/referring physician Rajan R Revankar, MD 02/20/2019 5:00 PM 

## 2019-03-01 DIAGNOSIS — I2510 Atherosclerotic heart disease of native coronary artery without angina pectoris: Secondary | ICD-10-CM | POA: Diagnosis not present

## 2019-03-01 DIAGNOSIS — I10 Essential (primary) hypertension: Secondary | ICD-10-CM | POA: Diagnosis not present

## 2019-03-01 DIAGNOSIS — E785 Hyperlipidemia, unspecified: Secondary | ICD-10-CM | POA: Diagnosis not present

## 2019-03-01 DIAGNOSIS — Z1329 Encounter for screening for other suspected endocrine disorder: Secondary | ICD-10-CM | POA: Diagnosis not present

## 2019-03-01 DIAGNOSIS — I472 Ventricular tachycardia: Secondary | ICD-10-CM | POA: Diagnosis not present

## 2019-03-01 LAB — CBC
Hematocrit: 43.5 % (ref 37.5–51.0)
Hemoglobin: 15 g/dL (ref 13.0–17.7)
MCH: 30.3 pg (ref 26.6–33.0)
MCHC: 34.5 g/dL (ref 31.5–35.7)
MCV: 88 fL (ref 79–97)
Platelets: 150 10*3/uL (ref 150–450)
RBC: 4.95 x10E6/uL (ref 4.14–5.80)
RDW: 13.5 % (ref 11.6–15.4)
WBC: 5 10*3/uL (ref 3.4–10.8)

## 2019-03-01 LAB — HEPATIC FUNCTION PANEL
ALT: 16 IU/L (ref 0–44)
AST: 18 IU/L (ref 0–40)
Albumin: 4.7 g/dL (ref 3.7–4.7)
Alkaline Phosphatase: 53 IU/L (ref 39–117)
Bilirubin Total: 0.6 mg/dL (ref 0.0–1.2)
Bilirubin, Direct: 0.2 mg/dL (ref 0.00–0.40)
Total Protein: 6.7 g/dL (ref 6.0–8.5)

## 2019-03-01 LAB — BASIC METABOLIC PANEL
BUN/Creatinine Ratio: 8 — ABNORMAL LOW (ref 10–24)
BUN: 12 mg/dL (ref 8–27)
CO2: 23 mmol/L (ref 20–29)
Calcium: 9.7 mg/dL (ref 8.6–10.2)
Chloride: 99 mmol/L (ref 96–106)
Creatinine, Ser: 1.45 mg/dL — ABNORMAL HIGH (ref 0.76–1.27)
GFR calc Af Amer: 55 mL/min/{1.73_m2} — ABNORMAL LOW (ref >59)
GFR calc non Af Amer: 47 mL/min/{1.73_m2} — ABNORMAL LOW (ref >59)
Glucose: 97 mg/dL (ref 65–99)
Potassium: 5.2 mmol/L (ref 3.5–5.2)
Sodium: 136 mmol/L (ref 134–144)

## 2019-03-01 LAB — TSH: TSH: 2.53 u[IU]/mL (ref 0.450–4.500)

## 2019-03-01 LAB — LIPID PANEL
Chol/HDL Ratio: 2.7 ratio (ref 0.0–5.0)
Cholesterol, Total: 139 mg/dL (ref 100–199)
HDL: 51 mg/dL (ref >39)
LDL Chol Calc (NIH): 72 mg/dL (ref 0–99)
Triglycerides: 82 mg/dL (ref 0–149)
VLDL Cholesterol Cal: 16 mg/dL (ref 5–40)

## 2019-03-07 ENCOUNTER — Telehealth: Payer: Self-pay

## 2019-03-07 NOTE — Telephone Encounter (Signed)
Results relayed, copy sent to Dr. Schultz 

## 2019-03-07 NOTE — Telephone Encounter (Signed)
-----   Message from Jenean Lindau, MD sent at 03/04/2019  9:04 AM EST ----- The results of the study is unremarkable. Please inform patient. I will discuss in detail at next appointment. Cc  primary care/referring physician Jenean Lindau, MD 03/04/2019 9:04 AM

## 2019-03-12 DIAGNOSIS — K2100 Gastro-esophageal reflux disease with esophagitis, without bleeding: Secondary | ICD-10-CM | POA: Diagnosis not present

## 2019-05-17 ENCOUNTER — Other Ambulatory Visit: Payer: Self-pay

## 2019-05-17 ENCOUNTER — Encounter: Payer: Self-pay | Admitting: Cardiology

## 2019-05-17 ENCOUNTER — Ambulatory Visit (INDEPENDENT_AMBULATORY_CARE_PROVIDER_SITE_OTHER): Payer: PPO | Admitting: Cardiology

## 2019-05-17 VITALS — BP 124/70 | HR 79 | Ht 69.0 in | Wt 196.0 lb

## 2019-05-17 DIAGNOSIS — I472 Ventricular tachycardia: Secondary | ICD-10-CM

## 2019-05-17 DIAGNOSIS — I2510 Atherosclerotic heart disease of native coronary artery without angina pectoris: Secondary | ICD-10-CM | POA: Diagnosis not present

## 2019-05-17 DIAGNOSIS — I10 Essential (primary) hypertension: Secondary | ICD-10-CM | POA: Diagnosis not present

## 2019-05-17 DIAGNOSIS — E785 Hyperlipidemia, unspecified: Secondary | ICD-10-CM | POA: Diagnosis not present

## 2019-05-17 DIAGNOSIS — I4729 Other ventricular tachycardia: Secondary | ICD-10-CM

## 2019-05-17 NOTE — Patient Instructions (Signed)
Medication Instructions:  Your physician recommends that you continue on your current medications as directed. Please refer to the Current Medication list given to you today.  *If you need a refill on your cardiac medications before your next appointment, please call your pharmacy*  Lab Work: NONE If you have labs (blood work) drawn today and your tests are completely normal, you will receive your results only by: Marland Kitchen MyChart Message (if you have MyChart) OR . A paper copy in the mail If you have any lab test that is abnormal or we need to change your treatment, we will call you to review the results.  Testing/Procedures: You had an EKG performed today  Follow-Up: At [facility], you and your health needs are our priority.  As part of our continuing mission to provide you with exceptional heart care, we have created designated Provider Care Teams.  These Care Teams include your primary Cardiologist (physician) and Advanced Practice Providers (APPs -  Physician Assistants and Nurse Practitioners) who all work together to provide you with the care you need, when you need it.  Your next appointment:   3 month(s)  The format for your next appointment:   In Person  Provider:   Jyl Heinz, MD

## 2019-05-17 NOTE — Progress Notes (Signed)
Cardiology Office Note:    Date:  05/17/2019   ID:  Duane Hamilton, DOB [birth year], MRN [id number]  PCP:  Nicoletta Dress, MD  Cardiologist:  Jenean Lindau, MD   Referring MD: Nicoletta Dress, MD    ASSESSMENT:    1. Coronary artery disease involving native coronary artery of native heart without angina pectoris   2. Essential hypertension   3. 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 Dyslipidemia    PLAN:    In order of problems listed above:  1. Coronary artery disease: Secondary prevention stressed to the patient.  Importance of compliance with diet and medication stressed and he vocalized understanding.  I told him to walk at least half an hour a day 5 days a week and he is agreeable. 2. Essential hypertension: Blood pressure is stable and dietary interventions were detailed again 3. Mixed dyslipidemia: Patient is following up with diet.  He will be back in 3 months for follow-up appointment at which time we will do fasting lipids.  He had multiple questions which were answered to his satisfaction.   Medication Adjustments/Labs and Tests Ordered: Current medicines are reviewed at length with the patient today.  Concerns regarding medicines are outlined above.  No orders of the defined types were placed in this encounter.  No orders of the defined types were placed in this encounter.    No chief complaint on file.    History of Present Illness:    Duane Hamilton is a 74 y.o. male.  Patient has history of coronary artery disease.  Medical therapy was recommended.  Patient is currently doing fine he is on Ranexa 1000 mg twice daily and tolerating it well.  He denies any chest pain orthopnea or PND.  He is walking on a regular basis though he does not walk 5 days a week.  At the time of my evaluation, the patient is alert awake oriented and in no distress.  Past Medical History:  Diagnosis Date  . Aortic stenosis   . Dyslipidemia 01/13/2015  . Esophageal  stricture   . Essential hypertension 01/13/2015  . Hyperlipidemia   . Palpitations   . Renal insufficiency, mild 01/13/2015  . 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 Shortness of breath     Past Surgical History:  Procedure Laterality Date  . LEFT HEART CATH AND CORONARY ANGIOGRAPHY N/A 01/21/2019   Procedure: LEFT HEART CATH AND CORONARY ANGIOGRAPHY;  Surgeon: Belva Crome, MD;  Location: [place] [facility];  Service: Cardiovascular;  Laterality: N/A;    Current Medications: Current Meds  Medication Sig  . aspirin EC 81 MG tablet Take 81 mg by mouth daily.   Marland Kitchen azelastine (ASTELIN) 0.1 % nasal spray Place 2 sprays into both nostrils daily as needed for rhinitis or allergies.   Marland Kitchen levothyroxine (SYNTHROID, LEVOTHROID) 75 MCG tablet Take 75 mcg by mouth daily before breakfast.   . metoprolol succinate (TOPROL-XL) 50 MG 24 hr tablet Take 50 mg by mouth daily.   . pantoprazole (PROTONIX) 40 MG tablet Take 40 mg by mouth daily.  . ranolazine (RANEXA) 500 MG 12 hr tablet Take 0.5 tablets (500 mg) twice daily for 14 days then increase to 1 tablet (1000 mg) twice daily thereafter  . simvastatin (ZOCOR) 40 MG tablet Take 40 mg by mouth daily at 6 PM.   . tamsulosin (FLOMAX) 0.4 MG CAPS capsule Take 0.4 mg by mouth daily.      Allergies:  Lisinopril   Social History   Socioeconomic History  . Marital status: Married    Spouse name: Not on file  . Number of children: Not on file  . Years of education: Not on file  . Highest education level: Not on file  Occupational History  . Not on file  Tobacco Use  . Smoking status: Never Smoker  . Smokeless tobacco: Never Used  Substance and Sexual Activity  . Alcohol use: No  . Drug use: No  . Sexual activity: Not on file  Other Topics Concern  . Not on file  Social History Narrative  . Not on file   Social Determinants of Health   Financial Resource Strain:   . Difficulty of Paying Living Expenses: Not on file  Food  Insecurity:   . Worried About Charity fundraiser in the Last Year: Not on file  . Ran Out of Food in the Last Year: Not on file  Transportation Needs:   . Lack of Transportation (Medical): Not on file  . Lack of Transportation (Non-Medical): Not on file  Physical Activity:   . Days of Exercise per Week: Not on file  . Minutes of Exercise per Session: Not on file  Stress:   . Feeling of Stress : Not on file  Social Connections:   . Frequency of Communication with Friends and Family: Not on file  . Frequency of Social Gatherings with Friends and Family: Not on file  . Attends Religious Services: Not on file  . Active Member of Clubs or Organizations: Not on file  . Attends Archivist Meetings: Not on file  . Marital Status: Not on file     Family History: The patient's family history includes Atrial fibrillation in his father.  ROS:   Please see the history of present illness.    All other systems reviewed and are negative.  EKGs/Labs/Other Studies Reviewed:    The following studies were reviewed today: EKG reveals sinus rhythm and nonspecific ST-T changes   Recent Labs: 03/01/2019: ALT 16; BUN 12; Creatinine, Ser 1.45; Hemoglobin 15.0; Platelets 150; Potassium 5.2; Sodium 136; TSH 2.530  Recent Lipid Panel    Component Value Date/Time   CHOL 139 03/01/2019 0817   TRIG 82 03/01/2019 0817   HDL 51 03/01/2019 0817   CHOLHDL 2.7 03/01/2019 0817   LDLCALC 72 03/01/2019 0817    Physical Exam:    VS:  BP 124/70   Pulse 79   Ht 5\' 9"  (1.753 m)   Wt 196 lb (88.9 kg)   SpO2 97%   BMI 28.94 kg/m     Wt Readings from Last 3 Encounters:  05/17/19 196 lb (88.9 kg)  02/20/19 195 lb (88.5 kg)  01/22/19 191 lb (86.6 kg)     GEN: Patient is in no acute distress HEENT: Normal NECK: No JVD; No carotid bruits LYMPHATICS: No lymphadenopathy CARDIAC: Hear sounds regular, 2/6 systolic murmur at the apex. RESPIRATORY:  Clear to auscultation without rales, wheezing  or rhonchi  ABDOMEN: Soft, non-tender, non-distended MUSCULOSKELETAL:  No edema; No deformity  SKIN: Warm and dry NEUROLOGIC:  Alert and oriented x 3 PSYCHIATRIC:  Normal affect   Signed, Jenean Lindau, MD  05/17/2019 2:15 PM    [facility]

## 2019-05-17 NOTE — Addendum Note (Signed)
Addended by: Beckey Rutter on: 05/17/2019 02:31 PM   Modules accepted: Orders

## 2019-07-18 DIAGNOSIS — Z125 Encounter for screening for malignant neoplasm of prostate: Secondary | ICD-10-CM | POA: Diagnosis not present

## 2019-07-18 DIAGNOSIS — Z6829 Body mass index (BMI) 29.0-29.9, adult: Secondary | ICD-10-CM | POA: Diagnosis not present

## 2019-07-18 DIAGNOSIS — K222 Esophageal obstruction: Secondary | ICD-10-CM | POA: Diagnosis not present

## 2019-07-18 DIAGNOSIS — R7301 Impaired fasting glucose: Secondary | ICD-10-CM | POA: Diagnosis not present

## 2019-07-18 DIAGNOSIS — I10 Essential (primary) hypertension: Secondary | ICD-10-CM | POA: Diagnosis not present

## 2019-07-18 DIAGNOSIS — E039 Hypothyroidism, unspecified: Secondary | ICD-10-CM | POA: Diagnosis not present

## 2019-07-18 DIAGNOSIS — E785 Hyperlipidemia, unspecified: Secondary | ICD-10-CM | POA: Diagnosis not present

## 2019-07-18 DIAGNOSIS — K219 Gastro-esophageal reflux disease without esophagitis: Secondary | ICD-10-CM | POA: Diagnosis not present

## 2019-07-23 DIAGNOSIS — J019 Acute sinusitis, unspecified: Secondary | ICD-10-CM | POA: Diagnosis not present

## 2019-07-23 DIAGNOSIS — B9689 Other specified bacterial agents as the cause of diseases classified elsewhere: Secondary | ICD-10-CM | POA: Diagnosis not present

## 2019-07-23 DIAGNOSIS — J302 Other seasonal allergic rhinitis: Secondary | ICD-10-CM | POA: Diagnosis not present

## 2019-07-28 ENCOUNTER — Other Ambulatory Visit: Payer: Self-pay | Admitting: Cardiology

## 2019-07-30 DIAGNOSIS — L570 Actinic keratosis: Secondary | ICD-10-CM | POA: Diagnosis not present

## 2019-07-31 DIAGNOSIS — N401 Enlarged prostate with lower urinary tract symptoms: Secondary | ICD-10-CM | POA: Diagnosis not present

## 2019-07-31 DIAGNOSIS — R351 Nocturia: Secondary | ICD-10-CM | POA: Diagnosis not present

## 2019-08-14 ENCOUNTER — Other Ambulatory Visit: Payer: Self-pay | Admitting: Cardiology

## 2019-08-14 ENCOUNTER — Other Ambulatory Visit: Payer: Self-pay

## 2019-08-15 NOTE — Telephone Encounter (Signed)
Spoke to patient's wife confirming he takes 1 tables BID. Rx refill sent to pharmacy.

## 2019-08-16 ENCOUNTER — Other Ambulatory Visit: Payer: Self-pay

## 2019-08-16 ENCOUNTER — Encounter: Payer: Self-pay | Admitting: Cardiology

## 2019-08-16 ENCOUNTER — Ambulatory Visit: Payer: PPO | Admitting: Cardiology

## 2019-08-16 VITALS — BP 130/64 | HR 73 | Ht 69.0 in | Wt 192.0 lb

## 2019-08-16 DIAGNOSIS — E785 Hyperlipidemia, unspecified: Secondary | ICD-10-CM

## 2019-08-16 DIAGNOSIS — N289 Disorder of kidney and ureter, unspecified: Secondary | ICD-10-CM

## 2019-08-16 DIAGNOSIS — I2510 Atherosclerotic heart disease of native coronary artery without angina pectoris: Secondary | ICD-10-CM | POA: Diagnosis not present

## 2019-08-16 NOTE — Progress Notes (Signed)
Cardiology Office Note:    Date:  08/16/2019   ID:  Duane Hamilton, DOB 1946-03-03, MRN [id number]  PCP:  Nicoletta Dress, MD  Cardiologist:  Jenean Lindau, MD   Referring MD: Nicoletta Dress, MD    ASSESSMENT:    1. Coronary artery disease involving native coronary artery of native heart without angina pectoris   2. Dyslipidemia   3. Renal insufficiency, mild    PLAN:    In order of problems listed above:  1. Coronary artery disease: Secondary prevention stressed with the patient.  Importance of compliance with diet medication stressed and he vocalized understanding.  I told him to walk at least half an hour a day 5 days a week and he promises to do so. 2. Essential hypertension: Blood pressure is stable 3. Mixed dyslipidemia: I reviewed lab work from [employer] and discussed with him.  Lipids are fine 4. Renal insufficiency: Stable managed by primary care physician.  He understands the precautions that he needs to take. 5. Patient will be seen in follow-up appointment in 6 months or earlier if the patient has any concerns    Medication Adjustments/Labs and Tests Ordered: Current medicines are reviewed at length with the patient today.  Concerns regarding medicines are outlined above.  No orders of the defined types were placed in this encounter.  No orders of the defined types were placed in this encounter.    Chief Complaint  Patient presents with  . Follow-up     History of Present Illness:    Duane Hamilton is a 74 y.o. male.  Patient has past medical history of coronary artery disease, essential hypertension and dyslipidemia.  He denies any problems at this time and takes care of activities of daily living.  No chest pain orthopnea or PND.  He leads a sedentary lifestyle.  At the time of my evaluation, the patient is alert awake oriented and in no distress.  He mentions to me that he does not exercise much because he is taking care of his wife who has had  knee surgery.  Past Medical History:  Diagnosis Date  . Aortic stenosis   . CAD (coronary artery disease) 12/05/2017  . Dyslipidemia 01/13/2015  . Esophageal stricture   . Essential hypertension 01/13/2015  . Hyperlipidemia   . 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 Palpitations   . Renal insufficiency, mild 01/13/2015  . 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 Shortness of breath     Past Surgical History:  Procedure Laterality Date  . LEFT HEART CATH AND CORONARY ANGIOGRAPHY N/A 01/21/2019   Procedure: LEFT HEART CATH AND CORONARY ANGIOGRAPHY;  Surgeon: Belva Crome, MD;  Location: [place] [facility];  Service: Cardiovascular;  Laterality: N/A;    Current Medications: Current Meds  Medication Sig  . aspirin EC 81 MG tablet Take 81 mg by mouth daily.   Marland Kitchen azelastine (ASTELIN) 0.1 % nasal spray Place 2 sprays into both nostrils daily as needed for rhinitis or allergies.   . cetirizine (ZYRTEC) 10 MG tablet Take 10 mg by mouth at bedtime.  Marland Kitchen levothyroxine (SYNTHROID, LEVOTHROID) 75 MCG tablet Take 75 mcg by mouth daily before breakfast.   . metoprolol succinate (TOPROL-XL) 50 MG 24 hr tablet Take 50 mg by mouth daily.   . pantoprazole (PROTONIX) 40 MG tablet Take 40 mg by mouth daily.  . ranolazine (RANEXA) 1000 MG SR tablet TAKE 1/2 TABLET(500 MG) BY MOUTH TWICE DAILY FOR 14  DAYS. INCREASE TO 1 TABLET 1000 MG 2 TIMES DAILY THEREAFTER (Patient taking differently: Take 1,000 mg by mouth 2 (two) times daily. )  . simvastatin (ZOCOR) 40 MG tablet Take 40 mg by mouth daily at 6 PM.   . tamsulosin (FLOMAX) 0.4 MG CAPS capsule Take 0.4 mg by mouth daily.      Allergies:   Lisinopril   Social History   Socioeconomic History  . Marital status: Married    Spouse name: Not on file  . Number of children: Not on file  . Years of education: Not on file  . Highest education level: Not on file  Occupational History  . Not on file  Tobacco Use  . Smoking  status: Never Smoker  . Smokeless tobacco: Never Used  Substance and Sexual Activity  . Alcohol use: No  . Drug use: No  . Sexual activity: Not on file  Other Topics Concern  . Not on file  Social History Narrative  . Not on file   Social Determinants of Health   Financial Resource Strain:   . Difficulty of Paying Living Expenses:   Food Insecurity:   . Worried About Charity fundraiser in the Last Year:   . Arboriculturist in the Last Year:   Transportation Needs:   . Film/video editor (Medical):   Marland Kitchen Lack of Transportation (Non-Medical):   Physical Activity:   . Days of Exercise per Week:   . Minutes of Exercise per Session:   Stress:   . Feeling of Stress :   Social Connections:   . Frequency of Communication with Friends and Family:   . Frequency of Social Gatherings with Friends and Family:   . Attends Religious Services:   . Active Member of Clubs or Organizations:   . Attends Archivist Meetings:   Marland Kitchen Marital Status:      Family History: The patient's family history includes Atrial fibrillation in his father.  ROS:   Please see the history of present illness.    All other systems reviewed and are negative.  EKGs/Labs/Other Studies Reviewed:    The following studies were reviewed today: I discussed my findings with the patient at length   Recent Labs: 03/01/2019: ALT 16; BUN 12; Creatinine, Ser 1.45; Hemoglobin 15.0; Platelets 150; Potassium 5.2; Sodium 136; TSH 2.530  Recent Lipid Panel    Component Value Date/Time   CHOL 139 03/01/2019 0817   TRIG 82 03/01/2019 0817   HDL 51 03/01/2019 0817   CHOLHDL 2.7 03/01/2019 0817   LDLCALC 72 03/01/2019 0817    Physical Exam:    VS:  BP 130/64 (BP Location: Left Arm, Patient Position: Sitting, Cuff Size: Normal)   Pulse 73   Ht 5\' 9"  (1.753 m)   Wt 192 lb (87.1 kg)   SpO2 95%   BMI 28.35 kg/m     Wt Readings from Last 3 Encounters:  08/16/19 192 lb (87.1 kg)  05/17/19 196 lb (88.9 kg)   02/20/19 195 lb (88.5 kg)     GEN: Patient is in no acute distress HEENT: Normal NECK: No JVD; No carotid bruits LYMPHATICS: No lymphadenopathy CARDIAC: Hear sounds regular, 2/6 systolic murmur at the apex. RESPIRATORY:  Clear to auscultation without rales, wheezing or rhonchi  ABDOMEN: Soft, non-tender, non-distended MUSCULOSKELETAL:  No edema; No deformity  SKIN: Warm and dry NEUROLOGIC:  Alert and oriented x 3 PSYCHIATRIC:  Normal affect   Signed, Jenean Lindau, MD  08/16/2019 11:34  AM    [place]

## 2019-08-16 NOTE — Patient Instructions (Signed)
Medication Instructions:  No medication changes *If you need a refill on your cardiac medications before your next appointment, please call your pharmacy*   Lab Work: None ordered If you have labs (blood work) drawn today and your tests are completely normal, you will receive your results only by: Marland Kitchen MyChart Message (if you have MyChart) OR . A paper copy in the mail If you have any lab test that is abnormal or we need to change your treatment, we will call you to review the results.   Testing/Procedures: None orderede   Follow-Up: At [facility], you and your health needs are our priority.  As part of our continuing mission to provide you with exceptional heart care, we have created designated Provider Care Teams.  These Care Teams include your primary Cardiologist (physician) and Advanced Practice Providers (APPs -  Physician Assistants and Nurse Practitioners) who all work together to provide you with the care you need, when you need it.  We recommend signing up for the patient portal called "MyChart".  Sign up information is provided on this After Visit Summary.  MyChart is used to connect with patients for Virtual Visits (Telemedicine).  Patients are able to view lab/test results, encounter notes, upcoming appointments, etc.  Non-urgent messages can be sent to your provider as well.   To learn more about what you can do with MyChart, go to [email or web address].    Your next appointment:   6 month(s)  The format for your next appointment:   In Person  Provider:   Jyl Heinz, MD   Other Instructions NA

## 2019-08-20 ENCOUNTER — Other Ambulatory Visit: Payer: Self-pay | Admitting: Cardiology

## 2019-08-20 MED ORDER — RANOLAZINE ER 1000 MG PO TB12: 1000.0000 mg | ORAL_TABLET | Freq: Two times a day (BID) | ORAL | 3 refills | Status: DC

## 2019-08-20 NOTE — Telephone Encounter (Signed)
Refill sent in per request.  

## 2019-08-20 NOTE — Telephone Encounter (Signed)
*  STAT* If patient is at the pharmacy, call can be transferred to refill team.   1. Which medications need to be refilled? (please list name of each medication and dose if known)  ranolazine (RANEXA) 1000 MG SR tablet  2. Which pharmacy/location (including street and city if local pharmacy) is medication to be sent to?  [facility] [place], [place]  3. Do they need a 30 day or 90 day supply? 90 with refills  Pt says the pharmacy does not have a new rx for this medication   Pt is out of medication

## 2019-08-29 DIAGNOSIS — H268 Other specified cataract: Secondary | ICD-10-CM | POA: Diagnosis not present

## 2019-08-29 DIAGNOSIS — H25813 Combined forms of age-related cataract, bilateral: Secondary | ICD-10-CM | POA: Diagnosis not present

## 2019-08-29 DIAGNOSIS — H353121 Nonexudative age-related macular degeneration, left eye, early dry stage: Secondary | ICD-10-CM | POA: Diagnosis not present

## 2019-11-12 ENCOUNTER — Other Ambulatory Visit: Payer: Self-pay

## 2019-11-26 DIAGNOSIS — B9689 Other specified bacterial agents as the cause of diseases classified elsewhere: Secondary | ICD-10-CM | POA: Diagnosis not present

## 2019-11-26 DIAGNOSIS — J019 Acute sinusitis, unspecified: Secondary | ICD-10-CM | POA: Diagnosis not present

## 2020-01-06 ENCOUNTER — Other Ambulatory Visit: Payer: Self-pay | Admitting: Cardiology

## 2020-01-20 DIAGNOSIS — K222 Esophageal obstruction: Secondary | ICD-10-CM | POA: Diagnosis not present

## 2020-01-20 DIAGNOSIS — I10 Essential (primary) hypertension: Secondary | ICD-10-CM | POA: Diagnosis not present

## 2020-01-20 DIAGNOSIS — R7301 Impaired fasting glucose: Secondary | ICD-10-CM | POA: Diagnosis not present

## 2020-01-20 DIAGNOSIS — E039 Hypothyroidism, unspecified: Secondary | ICD-10-CM | POA: Diagnosis not present

## 2020-01-20 DIAGNOSIS — I2510 Atherosclerotic heart disease of native coronary artery without angina pectoris: Secondary | ICD-10-CM | POA: Diagnosis not present

## 2020-01-20 DIAGNOSIS — E785 Hyperlipidemia, unspecified: Secondary | ICD-10-CM | POA: Diagnosis not present

## 2020-01-20 DIAGNOSIS — K219 Gastro-esophageal reflux disease without esophagitis: Secondary | ICD-10-CM | POA: Diagnosis not present

## 2020-01-20 DIAGNOSIS — Z6828 Body mass index (BMI) 28.0-28.9, adult: Secondary | ICD-10-CM | POA: Diagnosis not present

## 2020-01-30 DIAGNOSIS — R351 Nocturia: Secondary | ICD-10-CM | POA: Diagnosis not present

## 2020-01-30 DIAGNOSIS — N401 Enlarged prostate with lower urinary tract symptoms: Secondary | ICD-10-CM | POA: Diagnosis not present

## 2020-02-04 DIAGNOSIS — L570 Actinic keratosis: Secondary | ICD-10-CM | POA: Diagnosis not present

## 2020-02-11 DIAGNOSIS — R0602 Shortness of breath: Secondary | ICD-10-CM | POA: Insufficient documentation

## 2020-02-11 DIAGNOSIS — E785 Hyperlipidemia, unspecified: Secondary | ICD-10-CM | POA: Insufficient documentation

## 2020-02-11 DIAGNOSIS — K222 Esophageal obstruction: Secondary | ICD-10-CM | POA: Insufficient documentation

## 2020-02-11 DIAGNOSIS — R002 Palpitations: Secondary | ICD-10-CM | POA: Insufficient documentation

## 2020-02-12 ENCOUNTER — Encounter: Payer: Self-pay | Admitting: Cardiology

## 2020-02-12 ENCOUNTER — Ambulatory Visit: Payer: PPO | Admitting: Cardiology

## 2020-02-12 ENCOUNTER — Other Ambulatory Visit: Payer: Self-pay

## 2020-02-12 VITALS — BP 134/72 | HR 62 | Ht 69.0 in | Wt 195.0 lb

## 2020-02-12 DIAGNOSIS — I10 Essential (primary) hypertension: Secondary | ICD-10-CM | POA: Diagnosis not present

## 2020-02-12 DIAGNOSIS — E785 Hyperlipidemia, unspecified: Secondary | ICD-10-CM | POA: Diagnosis not present

## 2020-02-12 DIAGNOSIS — I2510 Atherosclerotic heart disease of native coronary artery without angina pectoris: Secondary | ICD-10-CM

## 2020-02-12 NOTE — Patient Instructions (Signed)

## 2020-02-12 NOTE — Progress Notes (Signed)
Cardiology Office Note:    Date:  02/12/2020   ID:  Duane Hamilton, DOB [birth year], MRN [id number]  PCP:  Nicoletta Dress, MD  Cardiologist:  Jenean Lindau, MD   Referring MD: Nicoletta Dress, MD    ASSESSMENT:    1. Coronary artery disease involving native coronary artery of native heart without angina pectoris   2. Dyslipidemia   3. Essential hypertension    PLAN:    In order of problems listed above:  1. Coronary artery disease: Secondary prevention stressed to the patient.  Importance of compliance with diet medication stressed any vocalized understanding.  He was advised to exercise on a regular basis and walk at least half an hour a day 5 days a week and he promises to do so.  Weight reduction was advised. 2. Essential hypertension: Blood pressure is stable and diet was emphasized. 3. Mixed dyslipidemia: Diet was emphasized lipids were reviewed and they were fine. 4. Patient will be seen in follow-up appointment in 6 months or earlier if the patient has any concerns    Medication Adjustments/Labs and Tests Ordered: Current medicines are reviewed at length with the patient today.  Concerns regarding medicines are outlined above.  No orders of the defined types were placed in this encounter.  No orders of the defined types were placed in this encounter.    No chief complaint on file.    History of Present Illness:    Duane Hamilton is a 74 y.o. male.  Patient has past medical history of coronary artery disease, essential hypertension and dyslipidemia.  He has had history of mild renal insufficiency.  He denies any problems at this time and takes care of activities of daily living.  He mentions to me that in the past 6 months he has been sedentary and has not does not exercise.  No chest pain orthopnea or PND.  At the time of my evaluation, the patient is alert awake oriented and in no distress.  Past Medical History:  Diagnosis Date  . Aortic stenosis   .  CAD (coronary artery disease) 12/05/2017  . Dyslipidemia 01/13/2015  . Esophageal stricture   . Essential hypertension 01/13/2015  . Hyperlipidemia   . 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 Palpitations   . Renal insufficiency, mild 01/13/2015  . 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 Shortness of breath     Past Surgical History:  Procedure Laterality Date  . LEFT HEART CATH AND CORONARY ANGIOGRAPHY N/A 01/21/2019   Procedure: LEFT HEART CATH AND CORONARY ANGIOGRAPHY;  Surgeon: Belva Crome, MD;  Location: [facility];  Service: Cardiovascular;  Laterality: N/A;    Current Medications: Current Meds  Medication Sig  . aspirin EC 81 MG tablet Take 81 mg by mouth daily.   Marland Kitchen azelastine (ASTELIN) 0.1 % nasal spray Place 2 sprays into both nostrils daily as needed for rhinitis or allergies.   . cetirizine (ZYRTEC) 10 MG tablet Take 10 mg by mouth at bedtime.  Marland Kitchen levothyroxine (SYNTHROID, LEVOTHROID) 75 MCG tablet Take 75 mcg by mouth daily before breakfast.   . metoprolol succinate (TOPROL-XL) 50 MG 24 hr tablet Take 50 mg by mouth daily.   . ranolazine (RANEXA) 1000 MG SR tablet TAKE 1 TABLET(1000 MG) BY MOUTH TWICE DAILY  . simvastatin (ZOCOR) 40 MG tablet Take 40 mg by mouth daily at 6 PM.   . tamsulosin (FLOMAX) 0.4 MG CAPS capsule Take 0.4 mg by mouth daily.  Allergies:   Lisinopril   Social History   Socioeconomic History  . Marital status: Married    Spouse name: Not on file  . Number of children: Not on file  . Years of education: Not on file  . Highest education level: Not on file  Occupational History  . Not on file  Tobacco Use  . Smoking status: Never Smoker  . Smokeless tobacco: Never Used  Vaping Use  . Vaping Use: Never used  Substance and Sexual Activity  . Alcohol use: No  . Drug use: No  . Sexual activity: Not on file  Other Topics Concern  . Not on file  Social History Narrative  . Not on file   Social  Determinants of Health   Financial Resource Strain:   . Difficulty of Paying Living Expenses: Not on file  Food Insecurity:   . Worried About Charity fundraiser in the Last Year: Not on file  . Ran Out of Food in the Last Year: Not on file  Transportation Needs:   . Lack of Transportation (Medical): Not on file  . Lack of Transportation (Non-Medical): Not on file  Physical Activity:   . Days of Exercise per Week: Not on file  . Minutes of Exercise per Session: Not on file  Stress:   . Feeling of Stress : Not on file  Social Connections:   . Frequency of Communication with Friends and Family: Not on file  . Frequency of Social Gatherings with Friends and Family: Not on file  . Attends Religious Services: Not on file  . Active Member of Clubs or Organizations: Not on file  . Attends Archivist Meetings: Not on file  . Marital Status: Not on file     Family History: The patient's family history includes Atrial fibrillation in his father.  ROS:   Please see the history of present illness.    All other systems reviewed and are negative.  EKGs/Labs/Other Studies Reviewed:    The following studies were reviewed today: Lab work was reviewed from [employer]   Recent Labs: 03/01/2019: ALT 16; BUN 12; Creatinine, Ser 1.45; Hemoglobin 15.0; Platelets 150; Potassium 5.2; Sodium 136; TSH 2.530  Recent Lipid Panel    Component Value Date/Time   CHOL 139 03/01/2019 0817   TRIG 82 03/01/2019 0817   HDL 51 03/01/2019 0817   CHOLHDL 2.7 03/01/2019 0817   LDLCALC 72 03/01/2019 0817    Physical Exam:    VS:  BP 134/72   Pulse 62   Ht 5\' 9"  (1.753 m)   Wt 195 lb (88.5 kg)   SpO2 98%   BMI 28.80 kg/m     Wt Readings from Last 3 Encounters:  02/12/20 195 lb (88.5 kg)  08/16/19 192 lb (87.1 kg)  05/17/19 196 lb (88.9 kg)     GEN: Patient is in no acute distress HEENT: Normal NECK: No JVD; No carotid bruits LYMPHATICS: No lymphadenopathy CARDIAC: Hear sounds  regular, 2/6 systolic murmur at the apex. RESPIRATORY:  Clear to auscultation without rales, wheezing or rhonchi  ABDOMEN: Soft, non-tender, non-distended MUSCULOSKELETAL:  No edema; No deformity  SKIN: Warm and dry NEUROLOGIC:  Alert and oriented x 3 PSYCHIATRIC:  Normal affect   Signed, Jenean Lindau, MD  02/12/2020 3:27 PM    [place]

## 2020-02-14 DIAGNOSIS — Z23 Encounter for immunization: Secondary | ICD-10-CM | POA: Diagnosis not present

## 2020-03-31 DIAGNOSIS — B9689 Other specified bacterial agents as the cause of diseases classified elsewhere: Secondary | ICD-10-CM | POA: Diagnosis not present

## 2020-03-31 DIAGNOSIS — J019 Acute sinusitis, unspecified: Secondary | ICD-10-CM | POA: Diagnosis not present

## 2020-04-02 DIAGNOSIS — N401 Enlarged prostate with lower urinary tract symptoms: Secondary | ICD-10-CM | POA: Diagnosis not present

## 2020-04-02 DIAGNOSIS — R351 Nocturia: Secondary | ICD-10-CM | POA: Diagnosis not present

## 2020-04-14 DIAGNOSIS — Z1331 Encounter for screening for depression: Secondary | ICD-10-CM | POA: Diagnosis not present

## 2020-04-14 DIAGNOSIS — E785 Hyperlipidemia, unspecified: Secondary | ICD-10-CM | POA: Diagnosis not present

## 2020-04-14 DIAGNOSIS — Z9181 History of falling: Secondary | ICD-10-CM | POA: Diagnosis not present

## 2020-04-14 DIAGNOSIS — Z0000 Encounter for general adult medical examination without abnormal findings: Secondary | ICD-10-CM | POA: Diagnosis not present

## 2020-04-14 DIAGNOSIS — Z139 Encounter for screening, unspecified: Secondary | ICD-10-CM | POA: Diagnosis not present

## 2020-05-06 DIAGNOSIS — N411 Chronic prostatitis: Secondary | ICD-10-CM | POA: Diagnosis not present

## 2020-05-06 DIAGNOSIS — N401 Enlarged prostate with lower urinary tract symptoms: Secondary | ICD-10-CM | POA: Diagnosis not present

## 2020-05-06 DIAGNOSIS — R351 Nocturia: Secondary | ICD-10-CM | POA: Diagnosis not present

## 2020-06-15 ENCOUNTER — Telehealth: Payer: Self-pay | Admitting: Cardiology

## 2020-06-15 NOTE — Telephone Encounter (Signed)
Pt states that he had an episode on 2/26 of rapid heart rate of 116-118 after hitting gold balls. Pt states that it took approx 30-45 mins for it to go back to normal. Pt states that he did not have chest pain. Pt has an appt on 06/17/20. Advised to call 911 or go to the ED for chest tightness or rapid heart rate he cannot get it controlled. Pt verbalized understanding and had no additional questions.

## 2020-06-15 NOTE — Telephone Encounter (Signed)
STAT if HR is under 50 or over 120 (normal HR is 60-100 beats per minute)  1) What is your heart rate? On Sat had an episode was heart rate was ranging from 115 to 118  2) Do you have a log of your heart rate readings (document readings)?  No  3) Do you have any other symptoms? No- I made pt an appt for Wednesday, please call to evaluate

## 2020-06-16 ENCOUNTER — Other Ambulatory Visit: Payer: Self-pay

## 2020-06-17 ENCOUNTER — Encounter: Payer: Self-pay | Admitting: Cardiology

## 2020-06-17 ENCOUNTER — Ambulatory Visit: Payer: PPO | Admitting: Cardiology

## 2020-06-17 ENCOUNTER — Other Ambulatory Visit: Payer: Self-pay

## 2020-06-17 VITALS — BP 130/66 | HR 84 | Ht 69.0 in | Wt 196.6 lb

## 2020-06-17 DIAGNOSIS — R002 Palpitations: Secondary | ICD-10-CM

## 2020-06-17 DIAGNOSIS — N289 Disorder of kidney and ureter, unspecified: Secondary | ICD-10-CM

## 2020-06-17 DIAGNOSIS — I10 Essential (primary) hypertension: Secondary | ICD-10-CM | POA: Diagnosis not present

## 2020-06-17 DIAGNOSIS — I2510 Atherosclerotic heart disease of native coronary artery without angina pectoris: Secondary | ICD-10-CM

## 2020-06-17 DIAGNOSIS — E782 Mixed hyperlipidemia: Secondary | ICD-10-CM | POA: Diagnosis not present

## 2020-06-17 MED ORDER — NITROGLYCERIN 0.4 MG SL SUBL: 0.4000 mg | SUBLINGUAL_TABLET | SUBLINGUAL | 6 refills | Status: DC | PRN

## 2020-06-17 NOTE — Addendum Note (Signed)
Addended by: Truddie Hidden on: 06/17/2020 02:39 PM   Modules accepted: Orders

## 2020-06-17 NOTE — Patient Instructions (Signed)
Medication Instructions:  No medication changes. *If you need a refill on your cardiac medications before your next appointment, please call your pharmacy*   Lab Work:  Your physician recommends that you return for lab work in: the next few days. You need to have labs done when you are fasting.  You can come Monday through Friday 8:30 am to 12:00 pm and 1:15 to 4:30. You do not need to make an appointment as the order has already been placed. The labs you are going to have done are BMET, CBC, TSH, LFT and Lipids.  If you have labs (blood work) drawn today and your tests are completely normal, you will receive your results only by: Marland Kitchen MyChart Message (if you have MyChart) OR . A paper copy in the mail If you have any lab test that is abnormal or we need to change your treatment, we will call you to review the results.   Testing/Procedures: Your physician has requested that you have an echocardiogram. Echocardiography is a painless test that uses sound waves to create images of your heart. It provides your doctor with information about the size and shape of your heart and how well your heart's chambers and valves are working. This procedure takes approximately one hour. There are no restrictions for this procedure.     Follow-Up: At [facility], you and your health needs are our priority.  As part of our continuing mission to provide you with exceptional heart care, we have created designated Provider Care Teams.  These Care Teams include your primary Cardiologist (physician) and Advanced Practice Providers (APPs -  Physician Assistants and Nurse Practitioners) who all work together to provide you with the care you need, when you need it.  We recommend signing up for the patient portal called "MyChart".  Sign up information is provided on this After Visit Summary.  MyChart is used to connect with patients for Virtual Visits (Telemedicine).  Patients are able to view lab/test results,  encounter notes, upcoming appointments, etc.  Non-urgent messages can be sent to your provider as well.   To learn more about what you can do with MyChart, go to [email or web address].    Your next appointment:   6 month(s)  The format for your next appointment:   In Person  Provider:   Jyl Heinz, MD   Other Instructions  Echocardiogram An echocardiogram is a test that uses sound waves (ultrasound) to produce images of the heart. Images from an echocardiogram can provide important information about:  Heart size and shape.  The size and thickness and movement of your heart's walls.  Heart muscle function and strength.  Heart valve function or if you have stenosis. Stenosis is when the heart valves are too narrow.  If blood is flowing backward through the heart valves (regurgitation).  A tumor or infectious growth around the heart valves.  Areas of heart muscle that are not working well because of poor blood flow or injury from a heart attack.  Aneurysm detection. An aneurysm is a weak or damaged part of an artery wall. The wall bulges out from the normal force of blood pumping through the body. Tell a health care provider about:  Any allergies you have.  All medicines you are taking, including vitamins, herbs, eye drops, creams, and over-the-counter medicines.  Any blood disorders you have.  Any surgeries you have had.  Any medical conditions you have.  Whether you are pregnant or may be pregnant. What are the risks? Generally, this  is a safe test. However, problems may occur, including an allergic reaction to dye (contrast) that may be used during the test. What happens before the test? No specific preparation is needed. You may eat and drink normally. What happens during the test?  You will take off your clothes from the waist up and put on a hospital gown.  Electrodes or electrocardiogram (ECG)patches may be placed on your chest. The electrodes or  patches are then connected to a device that monitors your heart rate and rhythm.  You will lie down on a table for an ultrasound exam. A gel will be applied to your chest to help sound waves pass through your skin.  A handheld device, called a transducer, will be pressed against your chest and moved over your heart. The transducer produces sound waves that travel to your heart and bounce back (or "echo" back) to the transducer. These sound waves will be captured in real-time and changed into images of your heart that can be viewed on a video monitor. The images will be recorded on a computer and reviewed by your health care provider.  You may be asked to change positions or hold your breath for a short time. This makes it easier to get different views or better views of your heart.  In some cases, you may receive contrast through an IV in one of your veins. This can improve the quality of the pictures from your heart. The procedure may vary among health care providers and hospitals.   What can I expect after the test? You may return to your normal, everyday life, including diet, activities, and medicines, unless your health care provider tells you not to do that. Follow these instructions at home:  It is up to you to get the results of your test. Ask your health care provider, or the department that is doing the test, when your results will be ready.  Keep all follow-up visits. This is important. Summary  An echocardiogram is a test that uses sound waves (ultrasound) to produce images of the heart.  Images from an echocardiogram can provide important information about the size and shape of your heart, heart muscle function, heart valve function, and other possible heart problems.  You do not need to do anything to prepare before this test. You may eat and drink normally.  After the echocardiogram is completed, you may return to your normal, everyday life, unless your health care provider  tells you not to do that. This information is not intended to replace advice given to you by your health care provider. Make sure you discuss any questions you have with your health care provider. Document Revised: 11/26/2019 Document Reviewed: 11/26/2019 Elsevier Patient Education  2021 [place].   Nitroglycerin sublingual tablets What is this medicine? NITROGLYCERIN (nye troe GLI ser in) is a type of vasodilator. It relaxes blood vessels, increasing the blood and oxygen supply to your heart. This medicine is used to relieve chest pain caused by angina. It is also used to prevent chest pain before activities like climbing stairs, going outdoors in cold weather, or sexual activity. This medicine may be used for other purposes; ask your health care provider or pharmacist if you have questions. COMMON BRAND NAME(S): Nitroquick, Nitrostat, Nitrotab What should I tell my health care provider before I take this medicine? They need to know if you have any of these conditions:  anemia  head injury, recent stroke, or bleeding in the brain  liver disease  previous heart attack  an unusual or allergic reaction to nitroglycerin, other medicines, foods, dyes, or preservatives  pregnant or trying to get pregnant  breast-feeding How should I use this medicine? Take this medicine by mouth as needed. Use at the first sign of an angina attack (chest pain or tightness). You can also take this medicine 5 to 10 minutes before an event likely to produce chest pain. Follow the directions exactly as written on the prescription label. Place one tablet under your tongue and let it dissolve. Do not swallow whole. Replace the dose if you accidentally swallow it. It will help if your mouth is not dry. Saliva around the tablet will help it to dissolve more quickly. Do not eat or drink, smoke or chew tobacco while a tablet is dissolving. Sit down when taking this medicine. In an angina attack, you should feel  better within 5 minutes after your first dose. You can take a dose every 5 minutes up to a total of 3 doses. If you do not feel better or feel worse after 1 dose, call 9-1-1 at once. Do not take more than 3 doses in 15 minutes. Your health care provider might give you other directions. Follow those directions if he or she does. Do not take your medicine more often than directed. Talk to your health care provider about the use of this medicine in children. Special care may be needed. Overdosage: If you think you have taken too much of this medicine contact a poison control center or emergency room at once. NOTE: This medicine is only for you. Do not share this medicine with others. What if I miss a dose? This does not apply. This medicine is only used as needed. What may interact with this medicine? Do not take this medicine with any of the following medications:  certain migraine medicines like ergotamine and dihydroergotamine (DHE)  medicines used to treat erectile dysfunction like sildenafil, tadalafil, and vardenafil  riociguat This medicine may also interact with the following medications:  alteplase  aspirin  heparin  medicines for high blood pressure  medicines for mental depression  other medicines used to treat angina  phenothiazines like chlorpromazine, mesoridazine, prochlorperazine, thioridazine This list may not describe all possible interactions. Give your health care provider a list of all the medicines, herbs, non-prescription drugs, or dietary supplements you use. Also tell them if you smoke, drink alcohol, or use illegal drugs. Some items may interact with your medicine. What should I watch for while using this medicine? Tell your doctor or health care professional if you feel your medicine is no longer working. Keep this medicine with you at all times. Sit or lie down when you take your medicine to prevent falling if you feel dizzy or faint after using it. Try to  remain calm. This will help you to feel better faster. If you feel dizzy, take several deep breaths and lie down with your feet propped up, or bend forward with your head resting between your knees. You may get drowsy or dizzy. Do not drive, use machinery, or do anything that needs mental alertness until you know how this drug affects you. Do not stand or sit up quickly, especially if you are an older patient. This reduces the risk of dizzy or fainting spells. Alcohol can make you more drowsy and dizzy. Avoid alcoholic drinks. Do not treat yourself for coughs, colds, or pain while you are taking this medicine without asking your doctor or health care professional for  advice. Some ingredients may increase your blood pressure. What side effects may I notice from receiving this medicine? Side effects that you should report to your doctor or health care professional as soon as possible:  allergic reactions (skin rash, itching or hives; swelling of the face, lips, or tongue)  low blood pressure (dizziness; feeling faint or lightheaded, falls; unusually weak or tired)  low red blood cell counts (trouble breathing; feeling faint; lightheaded, falls; unusually weak or tired) Side effects that usually do not require medical attention (report to your doctor or health care professional if they continue or are bothersome):  facial flushing (redness)  headache  nausea, vomiting This list may not describe all possible side effects. Call your doctor for medical advice about side effects. You may report side effects to FDA at 1-800-FDA-1088. Where should I keep my medicine? Keep out of the reach of children. Store at room temperature between 20 and 25 degrees C (68 and 77 degrees F). Store in [employer]. Protect from light and moisture. Keep tightly closed. Throw away any unused medicine after the expiration date. NOTE: This sheet is a summary. It may not cover all possible information. If you have  questions about this medicine, talk to your doctor, pharmacist, or health care provider.  2021 Elsevier/Gold Standard (2018-01-03 16:46:32)

## 2020-06-17 NOTE — Progress Notes (Signed)
Cardiology Office Note:    Date:  06/17/2020   ID:  [name], DOB 1946-01-24, MRN [id number]  PCP:  Nicoletta Dress, MD  Cardiologist:  Jenean Lindau, MD   Referring MD: Nicoletta Dress, MD    ASSESSMENT:    1. Coronary artery disease involving native coronary artery of native heart without angina pectoris   2. Essential hypertension   3. Mixed hyperlipidemia   4. Renal insufficiency, mild    PLAN:    In order of problems listed above:  1. Coronary artery disease: Secondary prevention stressed with the patient.  Importance of compliance with diet medication stressed and vocalized understanding.  He was advised to walk at least half an hour a day 5 days a week and he promises to do so.  Also for his letter sedentary lifestyle as he was helping take care of his wife. 2. Essential hypertension: Blood pressure stable and diet was discussed 3. Mixed dyslipidemia: Diet was emphasized.  Patient on statin therapy.  He will be back in the next few days for complete work-up blood work. 4. Cardiac murmur: Echocardiogram will be done to assess murmur on auscultation. 5. Patient will be seen in follow-up appointment in 6 months or earlier if the patient has any concerns 6. Sublingual nitroglycerin prescription was sent, its protocol and 911 protocol explained and the patient vocalized understanding questions were answered to the patient's satisfaction   Medication Adjustments/Labs and Tests Ordered: Current medicines are reviewed at length with the patient today.  Concerns regarding medicines are outlined above.  No orders of the defined types were placed in this encounter.  No orders of the defined types were placed in this encounter.    No chief complaint on file.    History of Present Illness:    Duane Hamilton is a 75 y.o. male.  Patient has past medical history of essential hypertension, dyslipidemia, renal insufficiency and coronary artery disease.  He denies any  problems at this time and takes care of activities of daily living.  No chest pain orthopnea or PND.  At the time of my evaluation, the patient is alert awake oriented and in no distress.  He leads a sedentary lifestyle.  Past Medical History:  Diagnosis Date  . Aortic stenosis   . CAD (coronary artery disease) 12/05/2017  . Dyslipidemia 01/13/2015  . Esophageal stricture   . Essential hypertension 01/13/2015  . Hyperlipidemia   . 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 Palpitations   . Renal insufficiency, mild 01/13/2015  . 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 Shortness of breath     Past Surgical History:  Procedure Laterality Date  . LEFT HEART CATH AND CORONARY ANGIOGRAPHY N/A 01/21/2019   Procedure: LEFT HEART CATH AND CORONARY ANGIOGRAPHY;  Surgeon: Belva Crome, MD;  Location: [facility];  Service: Cardiovascular;  Laterality: N/A;    Current Medications: Current Meds  Medication Sig  . aspirin EC 81 MG tablet Take 81 mg by mouth daily.   Marland Kitchen azelastine (ASTELIN) 0.1 % nasal spray Place 2 sprays into both nostrils daily as needed for rhinitis or allergies.   . cetirizine (ZYRTEC) 10 MG tablet Take 10 mg by mouth at bedtime.  . finasteride (PROSCAR) 5 MG tablet Take 5 mg by mouth daily.  Marland Kitchen levothyroxine (SYNTHROID, LEVOTHROID) 75 MCG tablet Take 75 mcg by mouth daily before breakfast.   . metoprolol succinate (TOPROL-XL) 50 MG 24 hr tablet Take 50 mg by  mouth daily.   . pantoprazole (PROTONIX) 40 MG tablet Take 40 mg by mouth daily.  . simvastatin (ZOCOR) 40 MG tablet Take 40 mg by mouth daily at 6 PM.   . tamsulosin (FLOMAX) 0.4 MG CAPS capsule Take 0.4 mg by mouth daily.      Allergies:   Lisinopril   Social History   Socioeconomic History  . Marital status: Married    Spouse name: Not on file  . Number of children: Not on file  . Years of education: Not on file  . Highest education level: Not on file  Occupational History   . Not on file  Tobacco Use  . Smoking status: Never Smoker  . Smokeless tobacco: Never Used  Vaping Use  . Vaping Use: Never used  Substance and Sexual Activity  . Alcohol use: No  . Drug use: No  . Sexual activity: Not on file  Other Topics Concern  . Not on file  Social History Narrative  . Not on file   Social Determinants of Health   Financial Resource Strain: Not on file  Food Insecurity: Not on file  Transportation Needs: Not on file  Physical Activity: Not on file  Stress: Not on file  Social Connections: Not on file     Family History: The patient's family history includes Atrial fibrillation in his father.  ROS:   Please see the history of present illness.    All other systems reviewed and are negative.  EKGs/Labs/Other Studies Reviewed:    The following studies were reviewed today: EKG reveals sinus rhythm and nonspecific ST-T changes inferior wall myocardial infarction of undetermined age.   Recent Labs: No results found for requested labs within last 8760 hours.  Recent Lipid Panel    Component Value Date/Time   CHOL 139 03/01/2019 0817   TRIG 82 03/01/2019 0817   HDL 51 03/01/2019 0817   CHOLHDL 2.7 03/01/2019 0817   LDLCALC 72 03/01/2019 0817    Physical Exam:    VS:  BP 130/66   Pulse 84   Ht 5\' 9"  (1.753 m)   Wt 196 lb 9.6 oz (89.2 kg)   SpO2 97%   BMI 29.03 kg/m     Wt Readings from Last 3 Encounters:  06/17/20 196 lb 9.6 oz (89.2 kg)  02/12/20 195 lb (88.5 kg)  08/16/19 192 lb (87.1 kg)     GEN: Patient is in no acute distress HEENT: Normal NECK: No JVD; No carotid bruits LYMPHATICS: No lymphadenopathy CARDIAC: Hear sounds regular, 2/6 systolic murmur at the apex. RESPIRATORY:  Clear to auscultation without rales, wheezing or rhonchi  ABDOMEN: Soft, non-tender, non-distended MUSCULOSKELETAL:  No edema; No deformity  SKIN: Warm and dry NEUROLOGIC:  Alert and oriented x 3 PSYCHIATRIC:  Normal affect   Signed, Jenean Lindau, MD  06/17/2020 2:15 PM    [facility]

## 2020-06-19 DIAGNOSIS — E782 Mixed hyperlipidemia: Secondary | ICD-10-CM | POA: Diagnosis not present

## 2020-06-19 DIAGNOSIS — I2510 Atherosclerotic heart disease of native coronary artery without angina pectoris: Secondary | ICD-10-CM | POA: Diagnosis not present

## 2020-06-19 DIAGNOSIS — R002 Palpitations: Secondary | ICD-10-CM | POA: Diagnosis not present

## 2020-06-19 DIAGNOSIS — I10 Essential (primary) hypertension: Secondary | ICD-10-CM | POA: Diagnosis not present

## 2020-06-23 LAB — BASIC METABOLIC PANEL
BUN/Creatinine Ratio: 9 — ABNORMAL LOW (ref 10–24)
BUN: 12 mg/dL (ref 8–27)
CO2: 17 mmol/L — ABNORMAL LOW (ref 20–29)
Calcium: 9.6 mg/dL (ref 8.6–10.2)
Chloride: 101 mmol/L (ref 96–106)
Creatinine, Ser: 1.39 mg/dL — ABNORMAL HIGH (ref 0.76–1.27)
Glucose: 93 mg/dL (ref 65–99)
Potassium: 4.5 mmol/L (ref 3.5–5.2)
Sodium: 140 mmol/L (ref 134–144)
eGFR: 53 mL/min/{1.73_m2} — ABNORMAL LOW (ref >59)

## 2020-06-23 LAB — CBC WITH DIFFERENTIAL/PLATELET
Basophils Absolute: 0 10*3/uL (ref 0.0–0.2)
Basos: 1 %
EOS (ABSOLUTE): 0.2 10*3/uL (ref 0.0–0.4)
Eos: 4 %
Hematocrit: 45.3 % (ref 37.5–51.0)
Hemoglobin: 15.7 g/dL (ref 13.0–17.7)
Immature Grans (Abs): 0 10*3/uL (ref 0.0–0.1)
Immature Granulocytes: 0 %
Lymphocytes Absolute: 1.6 10*3/uL (ref 0.7–3.1)
Lymphs: 32 %
MCH: 31.9 pg (ref 26.6–33.0)
MCHC: 34.7 g/dL (ref 31.5–35.7)
MCV: 92 fL (ref 79–97)
Monocytes Absolute: 0.6 10*3/uL (ref 0.1–0.9)
Monocytes: 11 %
Neutrophils Absolute: 2.6 10*3/uL (ref 1.4–7.0)
Neutrophils: 52 %
Platelets: 138 10*3/uL — ABNORMAL LOW (ref 150–450)
RBC: 4.92 x10E6/uL (ref 4.14–5.80)
RDW: 13.2 % (ref 11.6–15.4)
WBC: 5 10*3/uL (ref 3.4–10.8)

## 2020-06-23 LAB — TSH: TSH: 1.49 u[IU]/mL (ref 0.450–4.500)

## 2020-06-23 LAB — HEPATIC FUNCTION PANEL
ALT: 16 IU/L (ref 0–44)
AST: 22 IU/L (ref 0–40)
Albumin: 4.6 g/dL (ref 3.7–4.7)
Alkaline Phosphatase: 46 IU/L (ref 44–121)
Bilirubin Total: 0.5 mg/dL (ref 0.0–1.2)
Bilirubin, Direct: 0.14 mg/dL (ref 0.00–0.40)
Total Protein: 6.4 g/dL (ref 6.0–8.5)

## 2020-06-23 LAB — LIPID PANEL
Chol/HDL Ratio: 2.7 ratio (ref 0.0–5.0)
Cholesterol, Total: 130 mg/dL (ref 100–199)
HDL: 48 mg/dL (ref >39)
LDL Chol Calc (NIH): 66 mg/dL (ref 0–99)
Triglycerides: 80 mg/dL (ref 0–149)
VLDL Cholesterol Cal: 16 mg/dL (ref 5–40)

## 2020-07-17 ENCOUNTER — Ambulatory Visit (INDEPENDENT_AMBULATORY_CARE_PROVIDER_SITE_OTHER): Payer: PPO

## 2020-07-17 ENCOUNTER — Other Ambulatory Visit: Payer: Self-pay

## 2020-07-17 DIAGNOSIS — I10 Essential (primary) hypertension: Secondary | ICD-10-CM | POA: Diagnosis not present

## 2020-07-17 DIAGNOSIS — I2510 Atherosclerotic heart disease of native coronary artery without angina pectoris: Secondary | ICD-10-CM | POA: Diagnosis not present

## 2020-07-17 LAB — ECHOCARDIOGRAM COMPLETE
AR max vel: 1.27 cm2
AV Area VTI: 1.41 cm2
AV Area mean vel: 1.34 cm2
AV Mean grad: 10 mmHg
AV Peak grad: 20.1 mmHg
Ao pk vel: 2.24 m/s
Area-P 1/2: 3.28 cm2
P 1/2 time: 746 msec
S' Lateral: 3.4 cm

## 2020-07-17 NOTE — Progress Notes (Signed)
Complete echocardiogram performed.  Jimmy Jacole Capley RDCS, RVT  

## 2020-07-21 DIAGNOSIS — R7301 Impaired fasting glucose: Secondary | ICD-10-CM | POA: Diagnosis not present

## 2020-07-21 DIAGNOSIS — K219 Gastro-esophageal reflux disease without esophagitis: Secondary | ICD-10-CM | POA: Diagnosis not present

## 2020-07-21 DIAGNOSIS — K222 Esophageal obstruction: Secondary | ICD-10-CM | POA: Diagnosis not present

## 2020-07-21 DIAGNOSIS — E785 Hyperlipidemia, unspecified: Secondary | ICD-10-CM | POA: Diagnosis not present

## 2020-07-21 DIAGNOSIS — I10 Essential (primary) hypertension: Secondary | ICD-10-CM | POA: Diagnosis not present

## 2020-07-21 DIAGNOSIS — Z6830 Body mass index (BMI) 30.0-30.9, adult: Secondary | ICD-10-CM | POA: Diagnosis not present

## 2020-07-21 DIAGNOSIS — E039 Hypothyroidism, unspecified: Secondary | ICD-10-CM | POA: Diagnosis not present

## 2020-07-22 DIAGNOSIS — R351 Nocturia: Secondary | ICD-10-CM | POA: Diagnosis not present

## 2020-07-22 DIAGNOSIS — B372 Candidiasis of skin and nail: Secondary | ICD-10-CM | POA: Diagnosis not present

## 2020-07-22 DIAGNOSIS — N401 Enlarged prostate with lower urinary tract symptoms: Secondary | ICD-10-CM | POA: Diagnosis not present

## 2020-07-30 ENCOUNTER — Telehealth: Payer: Self-pay

## 2020-07-30 NOTE — Telephone Encounter (Signed)
Spoke with pt regarding his Kardia stripes after Dr. Harriet Masson reviewed. Advised pt that he could possibly be dehydrated and to drink plenty of fluids but overall normal stripes. Pt verbalized understanding and had no additional questions.

## 2020-08-04 DIAGNOSIS — K2100 Gastro-esophageal reflux disease with esophagitis, without bleeding: Secondary | ICD-10-CM | POA: Diagnosis not present

## 2020-08-07 DIAGNOSIS — T368X5A Adverse effect of other systemic antibiotics, initial encounter: Secondary | ICD-10-CM | POA: Diagnosis not present

## 2020-08-07 DIAGNOSIS — T7840XA Allergy, unspecified, initial encounter: Secondary | ICD-10-CM | POA: Diagnosis not present

## 2020-08-12 DIAGNOSIS — B349 Viral infection, unspecified: Secondary | ICD-10-CM | POA: Diagnosis not present

## 2020-08-12 DIAGNOSIS — T370X1A Poisoning by sulfonamides, accidental (unintentional), initial encounter: Secondary | ICD-10-CM | POA: Diagnosis not present

## 2020-08-17 DIAGNOSIS — N401 Enlarged prostate with lower urinary tract symptoms: Secondary | ICD-10-CM | POA: Diagnosis not present

## 2020-08-17 DIAGNOSIS — N411 Chronic prostatitis: Secondary | ICD-10-CM | POA: Diagnosis not present

## 2020-09-02 DIAGNOSIS — J3489 Other specified disorders of nose and nasal sinuses: Secondary | ICD-10-CM | POA: Diagnosis not present

## 2020-09-28 DIAGNOSIS — N411 Chronic prostatitis: Secondary | ICD-10-CM | POA: Diagnosis not present

## 2020-09-28 DIAGNOSIS — N401 Enlarged prostate with lower urinary tract symptoms: Secondary | ICD-10-CM | POA: Diagnosis not present

## 2020-10-21 DIAGNOSIS — H25813 Combined forms of age-related cataract, bilateral: Secondary | ICD-10-CM | POA: Diagnosis not present

## 2020-10-21 DIAGNOSIS — H353121 Nonexudative age-related macular degeneration, left eye, early dry stage: Secondary | ICD-10-CM | POA: Diagnosis not present

## 2020-11-03 ENCOUNTER — Telehealth: Payer: Self-pay | Admitting: Cardiology

## 2020-11-03 NOTE — Telephone Encounter (Signed)
Pt states that yesterday about 15 minutes after he finished mowing with the riding mower, push mower and weed eating he had a sudden onset of rapid, irregular heart rate of 130-145. Pt had chest discomfort which he used to NTG and was relieved of pain. Pt states that after the second Ntg his heart rate decreased faster than the first. Pt states the rapid heart rate lasted approximately 1 hour. Pt states he has had no more problems since that time. How do you advise?

## 2020-11-03 NOTE — Telephone Encounter (Signed)
Appointment made for 11/06/20. Pt advised of Jodelle Red which he has but did not think about it at the time.

## 2020-11-03 NOTE — Telephone Encounter (Signed)
STAT if HR is under 50 or over 120 (normal HR is 60-100 beats per minute)  What is your heart rate? 66-68 today  Do you have a log of your heart rate readings (document readings)? 135-130 yesterday  Do you have any other symptoms? No other symptoms    Patient states he had a rapid heart beat up in the 130 range yesterday evening. He states he did not have any other symptoms and has not had any since.

## 2020-11-05 ENCOUNTER — Other Ambulatory Visit: Payer: Self-pay

## 2020-11-05 DIAGNOSIS — H353221 Exudative age-related macular degeneration, left eye, with active choroidal neovascularization: Secondary | ICD-10-CM | POA: Diagnosis not present

## 2020-11-06 ENCOUNTER — Other Ambulatory Visit: Payer: Self-pay

## 2020-11-06 ENCOUNTER — Ambulatory Visit: Payer: PPO | Admitting: Cardiology

## 2020-11-06 ENCOUNTER — Encounter: Payer: Self-pay | Admitting: Cardiology

## 2020-11-06 VITALS — BP 130/56 | HR 74 | Ht 69.0 in | Wt 197.2 lb

## 2020-11-06 DIAGNOSIS — Z1329 Encounter for screening for other suspected endocrine disorder: Secondary | ICD-10-CM

## 2020-11-06 DIAGNOSIS — I10 Essential (primary) hypertension: Secondary | ICD-10-CM | POA: Diagnosis not present

## 2020-11-06 DIAGNOSIS — I472 Ventricular tachycardia: Secondary | ICD-10-CM | POA: Diagnosis not present

## 2020-11-06 DIAGNOSIS — E782 Mixed hyperlipidemia: Secondary | ICD-10-CM

## 2020-11-06 DIAGNOSIS — I4729 Other ventricular tachycardia: Secondary | ICD-10-CM

## 2020-11-06 DIAGNOSIS — I2510 Atherosclerotic heart disease of native coronary artery without angina pectoris: Secondary | ICD-10-CM

## 2020-11-06 NOTE — Addendum Note (Signed)
Addended by: Truddie Hidden on: 11/06/2020 09:40 AM   Modules accepted: Orders

## 2020-11-06 NOTE — Addendum Note (Signed)
Addended by: Tacari Repass, Jonelle Sidle L on: 11/06/2020 01:02 PM   Modules accepted: Orders

## 2020-11-06 NOTE — Progress Notes (Signed)
Cardiology Office Note:    Date:  11/06/2020   ID:  Duane Hamilton, DOB 1945-07-28, MRN [id number]  PCP:  Nicoletta Dress, MD  Cardiologist:  Jenean Lindau, MD   Referring MD: Nicoletta Dress, MD    ASSESSMENT:    1. Coronary artery disease involving native coronary artery of native heart without angina pectoris   2. Essential hypertension   3. Nonsustained ventricular tachycardia (HCC)    PLAN:    In order of problems listed above:  Coronary artery disease: Secondary prevention stressed with the patient.  Importance of compliance with diet medication stressed any vocalized understanding.  His symptoms of chest pain are not very clear.  He does not have a symptoms with exertion however into rest about 20 to 25 minutes he developed some substernal chest tightness.  I told him I would like to evaluate this with exercise stress Cardiolite and is agreeable. Essential hypertension: Blood pressure stable and diet was emphasized.  Lifestyle modification urged. Mixed dyslipidemia: Lipids were reviewed from the past and they will be rechecked in the next few days. He knows to go to the nearest emergency room for any significant symptoms. Patient will be seen in follow-up appointment in 6 months or earlier if the patient has any concerns    Medication Adjustments/Labs and Tests Ordered: Current medicines are reviewed at length with the patient today.  Concerns regarding medicines are outlined above.  No orders of the defined types were placed in this encounter.  No orders of the defined types were placed in this encounter.    No chief complaint on file.    History of Present Illness:    Duane Hamilton is a 75 y.o. male.  Patient has past medical history of coronary artery disease which is significant but nonobstructive in October 2020, essential hypertension, dyslipidemia.  He denies any problems at this time and takes care of activities of daily living.  He is only issue  is that he does significant amount of yard work.  20 to 25 minutes after he is done with everything left some episode of chest tightness with no radiation to the neck or to the arm and is concerned symptoms.  Again on exertion he does not develop the symptoms and he is a little perplexed about it.  He does have nitroglycerin handy.  At the time of my evaluation, the patient is alert awake oriented and in no distress.  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    azelastine (ASTELIN) 0.1 % nasal spray Place 2 sprays into both nostrils daily as needed for rhinitis or allergies.    finasteride (PROSCAR) 5 MG tablet Take 5 mg by mouth daily.   levothyroxine (SYNTHROID, LEVOTHROID) 75  MCG tablet Take 75 mcg by mouth daily before breakfast.    metoprolol succinate (TOPROL-XL) 50 MG 24 hr tablet Take 50 mg by mouth daily.    nitroGLYCERIN (NITROSTAT) 0.4 MG SL tablet Place 0.4 mg under the tongue every 5 (five) minutes as needed for chest pain.   pantoprazole (PROTONIX) 40 MG tablet Take 40 mg by mouth daily.   simvastatin (ZOCOR) 40 MG tablet Take 40 mg by mouth daily at 6 PM.    tamsulosin (FLOMAX) 0.4 MG CAPS capsule Take 0.4 mg by mouth daily.      Allergies:   Lisinopril    Social History   Socioeconomic History   Marital status: Married    Spouse name: Not on file   Number of children: Not on file   Years of education: Not on file   Highest education level: Not on file  Occupational History   Not on file  Tobacco Use   Smoking status: Never   Smokeless tobacco: Never  Vaping Use   Vaping Use: Never used  Substance and Sexual Activity   Alcohol use: No   Drug use: No   Sexual activity: Not on file  Other Topics Concern   Not on file  Social History Narrative   Not on file   Social Determinants of Health   Financial Resource Strain: Not on file  Food Insecurity: Not on file  Transportation Needs: Not on file  Physical Activity: Not on file  Stress: Not on file  Social Connections: Not on file     Family History: The patient's family history includes Atrial fibrillation in his father; Cancer in his mother; Heart disease in his sister; Hypertension in his mother.  ROS:   Please see the history of present illness.    All other systems reviewed and are negative.  EKGs/Labs/Other Studies Reviewed:    The following studies were reviewed today:  EKG reveals sinus rhythm and nonspecific ST-T changes  IMPRESSIONS     1. Left ventricular ejection fraction, by estimation, is 60 to 65%. The  left ventricle has normal function. The left ventricle has no regional  wall motion abnormalities. Left ventricular diastolic parameters are  consistent with Grade I diastolic  dysfunction (impaired relaxation).   2. The mitral valve is normal in structure. No evidence of mitral valve  regurgitation. No evidence of mitral stenosis.   3. The aortic valve is normal in structure. Aortic valve regurgitation is  mild. Mild to moderate aortic valve sclerosis/calcification is present,  without any evidence of aortic stenosis.   4. There is mild dilatation of the ascending aorta, measuring 37 mm.   Recent Labs: 06/19/2020: ALT 16; BUN 12; Creatinine, Ser  1.39; Hemoglobin 15.7; Platelets 138; Potassium 4.5; Sodium 140; TSH 1.490  Recent Lipid Panel    Component Value Date/Time   CHOL 130 06/19/2020 0845   TRIG 80 06/19/2020 0845   HDL 48 06/19/2020 0845   CHOLHDL 2.7 06/19/2020 0845   LDLCALC 66 06/19/2020 0845    Physical Exam:    VS:  BP (!) 130/56   Pulse 74   Ht '5\' 9"'$  (1.753 m)   Wt 197 lb 3.2 oz (89.4 kg)   SpO2 97%   BMI 29.12 kg/m     Wt Readings from Last 3 Encounters:  11/06/20 197 lb 3.2 oz (89.4 kg)  06/17/20 196 lb 9.6 oz (89.2 kg)  02/12/20 195 lb (88.5 kg)     GEN: Patient is in no acute distress HEENT:  Normal NECK: No JVD; No carotid bruits LYMPHATICS: No lymphadenopathy CARDIAC: Hear sounds regular, 2/6 systolic murmur at the apex. RESPIRATORY:  Clear to auscultation without rales, wheezing or rhonchi  ABDOMEN: Soft, non-tender, non-distended MUSCULOSKELETAL:  No edema; No deformity  SKIN: Warm and dry NEUROLOGIC:  Alert and oriented x 3 PSYCHIATRIC:  Normal affect   Signed, Jenean Lindau, MD  11/06/2020 9:21 AM    [place]

## 2020-11-06 NOTE — Patient Instructions (Signed)
Medication Instructions:  No medication changes. *If you need a refill on your cardiac medications before your next appointment, please call your pharmacy*   Lab Work: Your physician recommends that you return for lab work in: 1 month  You need to have labs done when you are fasting.  You can come Monday through Friday 8:30 am to 12:00 pm and 1:15 to 4:30. You do not need to make an appointment as the order has already been placed. The labs you are going to have done are BMET, CBC, TSH, LFT and Lipids.  If you have labs (blood work) drawn today and your tests are completely normal, you will receive your results only by: New Site (if you have MyChart) OR A paper copy in the mail If you have any lab test that is abnormal or we need to change your treatment, we will call you to review the results.   Testing/Procedures: Your physician has requested that you have a lexiscan myoview. For further information please visit [email or web address]. Please follow instruction sheet, as given.  The test will take approximately 3 to 4 hours to complete; you may bring reading material.  If someone comes with you to your appointment, they will need to remain in the main lobby due to limited space in the testing area.   How to prepare for your Myocardial Perfusion Test: Do not eat or drink 3 hours prior to your test, except you may have water. Do not consume products containing caffeine (regular or decaffeinated) 12 hours prior to your test. (ex: coffee, chocolate, sodas, tea). Do bring a list of your current medications with you.  If not listed below, you may take your medications as normal. Do not take metoprolol (Lopressor, Toprol) for 24 hours prior to the test.  Bring the medication to your appointment as you may be required to take it once the test is complete. Do wear comfortable clothes (no dresses or overalls) and walking shoes, tennis shoes preferred (No heels or open toe shoes are allowed). Do  NOT wear cologne, perfume, aftershave, or lotions (deodorant is allowed). If these instructions are not followed, your test will have to be rescheduled.    Follow-Up: At [facility], you and your health needs are our priority.  As part of our continuing mission to provide you with exceptional heart care, we have created designated Provider Care Teams.  These Care Teams include your primary Cardiologist (physician) and Advanced Practice Providers (APPs -  Physician Assistants and Nurse Practitioners) who all work together to provide you with the care you need, when you need it.  We recommend signing up for the patient portal called "MyChart".  Sign up information is provided on this After Visit Summary.  MyChart is used to connect with patients for Virtual Visits (Telemedicine).  Patients are able to view lab/test results, encounter notes, upcoming appointments, etc.  Non-urgent messages can be sent to your provider as well.   To learn more about what you can do with MyChart, go to [email or web address].    Your next appointment:   6 month(s)  The format for your next appointment:   In Person  Provider:   Jyl Heinz, MD   Other Instructions Cardiac Nuclear Scan A cardiac nuclear scan is a test that is done to check the flow of blood to your heart. It is done when you are resting and when you are exercising. The test looks for problems such as: Not enough blood reaching a portion of the heart. The  heart muscle not working as it should. You may need this test if: You have heart disease. You have had lab results that are not normal. You have had heart surgery or a balloon procedure to open up blocked arteries (angioplasty). You have chest pain. You have shortness of breath. In this test, a special dye (tracer) is put into your bloodstream. The tracer will travel to your heart. A camera will then take pictures of your heart to see how the tracer moves through your heart. This test  is usually done at a hospital and takes 2-4 hours. Tell a doctor about: Any allergies you have. All medicines you are taking, including vitamins, herbs, eye drops, creams, and over-the-counter medicines. Any problems you or family members have had with anesthetic medicines. Any blood disorders you have. Any surgeries you have had. Any medical conditions you have. Whether you are pregnant or may be pregnant. What are the risks? Generally, this is a safe test. However, problems may occur, such as: Serious chest pain and heart attack. This is only a risk if the stress portion of the test is done. Rapid heartbeat. A feeling of warmth in your chest. This feeling usually does not last long. Allergic reaction to the tracer. What happens before the test? Ask your doctor about changing or stopping your normal medicines. This is important. Follow instructions from your doctor about what you cannot eat or drink. Remove your jewelry on the day of the test. What happens during the test? An IV tube will be inserted into one of your veins. Your doctor will give you a small amount of tracer through the IV tube. You will wait for 20-40 minutes while the tracer moves through your bloodstream. Your heart will be monitored with an electrocardiogram (ECG). You will lie down on an exam table. Pictures of your heart will be taken for about 15-20 minutes. You may also have a stress test. For this test, one of these things may be done: You will be asked to exercise on a treadmill or a stationary bike. You will be given medicines that will make your heart work harder. This is done if you are unable to exercise. When blood flow to your heart has peaked, a tracer will again be given through the IV tube. After 20-40 minutes, you will get back on the exam table. More pictures will be taken of your heart. Depending on the tracer that is used, more pictures may need to be taken 3-4 hours later. Your IV tube will be  removed when the test is over. The test may vary among doctors and hospitals. What happens after the test? Ask your doctor: Whether you can return to your normal schedule, including diet, activities, and medicines. Whether you should drink more fluids. This will help to remove the tracer from your body. Drink enough fluid to keep your pee (urine) pale yellow. Ask your doctor, or the department that is doing the test: When will my results be ready? How will I get my results? Summary A cardiac nuclear scan is a test that is done to check the flow of blood to your heart. Tell your doctor whether you are pregnant or may be pregnant. Before the test, ask your doctor about changing or stopping your normal medicines. This is important. Ask your doctor whether you can return to your normal activities. You may be asked to drink more fluids. This information is not intended to replace advice given to you by your health care provider.  Make sure you discuss any questions you have with your health care provider. Document Revised: 07/25/2018 Document Reviewed: 09/18/2017 Elsevier Patient Education  [employer].

## 2020-11-09 ENCOUNTER — Telehealth (HOSPITAL_COMMUNITY): Payer: Self-pay | Admitting: *Deleted

## 2020-11-09 NOTE — Telephone Encounter (Signed)
Patient given detailed instructions per Myocardial Perfusion Study Information Sheet for the test on 11/11/20 Patient notified to arrive 15 minutes early and that it is imperative to arrive on time for appointment to keep from having the test rescheduled.  If you need to cancel or reschedule your appointment, please call the office within 24 hours of your appointment. . Patient verbalized understanding. Penna, Cait Locust Jacqueline   

## 2020-11-10 NOTE — Addendum Note (Signed)
Addended by: Jyl Heinz R on: 11/10/2020 08:20 AM   Modules accepted: Orders

## 2020-11-11 ENCOUNTER — Ambulatory Visit (INDEPENDENT_AMBULATORY_CARE_PROVIDER_SITE_OTHER): Payer: PPO

## 2020-11-11 ENCOUNTER — Other Ambulatory Visit: Payer: Self-pay

## 2020-11-11 DIAGNOSIS — I472 Ventricular tachycardia: Secondary | ICD-10-CM

## 2020-11-11 DIAGNOSIS — I2510 Atherosclerotic heart disease of native coronary artery without angina pectoris: Secondary | ICD-10-CM | POA: Diagnosis not present

## 2020-11-11 DIAGNOSIS — I10 Essential (primary) hypertension: Secondary | ICD-10-CM

## 2020-11-11 DIAGNOSIS — I4729 Other ventricular tachycardia: Secondary | ICD-10-CM

## 2020-11-11 LAB — MYOCARDIAL PERFUSION IMAGING
Estimated workload: 4.6 METS
Exercise duration (min): 4 min
Exercise duration (sec): 2 s
LV dias vol: 82 mL (ref 62–150)
LV sys vol: 29 mL
MPHR: 145 {beats}/min
Peak HR: 144 {beats}/min
Percent HR: 99 %
Rest HR: 61 {beats}/min
SDS: 3
SRS: 3
SSS: 6
TID: 0.98

## 2020-11-11 MED ORDER — TECHNETIUM TC 99M TETROFOSMIN IV KIT
30.5000 | PACK | Freq: Once | INTRAVENOUS | Status: AC | PRN
  Administered 2020-11-11: 30.5 via INTRAVENOUS

## 2020-11-11 MED ORDER — TECHNETIUM TC 99M TETROFOSMIN IV KIT
10.1000 | PACK | Freq: Once | INTRAVENOUS | Status: AC | PRN
  Administered 2020-11-11: 10.1 via INTRAVENOUS

## 2020-11-12 ENCOUNTER — Telehealth: Payer: Self-pay

## 2020-11-12 NOTE — Telephone Encounter (Signed)
Patient notified of the following recommendations.

## 2020-11-12 NOTE — Telephone Encounter (Signed)
-----   Message from Jenean Lindau, MD sent at 11/12/2020  9:53 AM EDT ----- Needs to get evaluated by primary care. ----- Message ----- From: Lowella Grip, CMA Sent: 11/11/2020   4:20 PM EDT To: Jenean Lindau, MD, Truddie Hidden, RN  Results reviewed with pt as per Dr. Julien Nordmann note.  Pt verbalized understanding and had no additional questions. Routed to PCP. Patient would like to know if Dr. Julien Nordmann has a possible cause as his stress was negative.  Return patient call at [phone].

## 2020-11-13 DIAGNOSIS — I2510 Atherosclerotic heart disease of native coronary artery without angina pectoris: Secondary | ICD-10-CM | POA: Diagnosis not present

## 2020-11-20 DIAGNOSIS — I2510 Atherosclerotic heart disease of native coronary artery without angina pectoris: Secondary | ICD-10-CM | POA: Diagnosis not present

## 2020-11-20 DIAGNOSIS — I10 Essential (primary) hypertension: Secondary | ICD-10-CM | POA: Diagnosis not present

## 2020-11-20 DIAGNOSIS — E782 Mixed hyperlipidemia: Secondary | ICD-10-CM | POA: Diagnosis not present

## 2020-11-20 DIAGNOSIS — I472 Ventricular tachycardia: Secondary | ICD-10-CM | POA: Diagnosis not present

## 2020-11-20 DIAGNOSIS — Z1329 Encounter for screening for other suspected endocrine disorder: Secondary | ICD-10-CM | POA: Diagnosis not present

## 2020-11-21 LAB — BASIC METABOLIC PANEL
BUN/Creatinine Ratio: 9 — ABNORMAL LOW (ref 10–24)
BUN: 12 mg/dL (ref 8–27)
CO2: 22 mmol/L (ref 20–29)
Calcium: 9 mg/dL (ref 8.6–10.2)
Chloride: 101 mmol/L (ref 96–106)
Creatinine, Ser: 1.3 mg/dL — ABNORMAL HIGH (ref 0.76–1.27)
Glucose: 89 mg/dL (ref 65–99)
Potassium: 4.5 mmol/L (ref 3.5–5.2)
Sodium: 138 mmol/L (ref 134–144)
eGFR: 57 mL/min/{1.73_m2} — ABNORMAL LOW (ref >59)

## 2020-11-21 LAB — HEPATIC FUNCTION PANEL
ALT: 14 IU/L (ref 0–44)
AST: 20 IU/L (ref 0–40)
Albumin: 4.4 g/dL (ref 3.7–4.7)
Alkaline Phosphatase: 44 IU/L (ref 44–121)
Bilirubin Total: 0.7 mg/dL (ref 0.0–1.2)
Bilirubin, Direct: 0.18 mg/dL (ref 0.00–0.40)
Total Protein: 6.1 g/dL (ref 6.0–8.5)

## 2020-11-21 LAB — CBC WITH DIFFERENTIAL/PLATELET
Basophils Absolute: 0 10*3/uL (ref 0.0–0.2)
Basos: 1 %
EOS (ABSOLUTE): 0.3 10*3/uL (ref 0.0–0.4)
Eos: 6 %
Hematocrit: 42.3 % (ref 37.5–51.0)
Hemoglobin: 14.6 g/dL (ref 13.0–17.7)
Immature Grans (Abs): 0 10*3/uL (ref 0.0–0.1)
Immature Granulocytes: 0 %
Lymphocytes Absolute: 1.5 10*3/uL (ref 0.7–3.1)
Lymphs: 29 %
MCH: 31.3 pg (ref 26.6–33.0)
MCHC: 34.5 g/dL (ref 31.5–35.7)
MCV: 91 fL (ref 79–97)
Monocytes Absolute: 0.5 10*3/uL (ref 0.1–0.9)
Monocytes: 10 %
Neutrophils Absolute: 2.8 10*3/uL (ref 1.4–7.0)
Neutrophils: 54 %
Platelets: 147 10*3/uL — ABNORMAL LOW (ref 150–450)
RBC: 4.66 x10E6/uL (ref 4.14–5.80)
RDW: 14.3 % (ref 11.6–15.4)
WBC: 5.1 10*3/uL (ref 3.4–10.8)

## 2020-11-21 LAB — LIPID PANEL
Chol/HDL Ratio: 2.8 ratio (ref 0.0–5.0)
Cholesterol, Total: 119 mg/dL (ref 100–199)
HDL: 42 mg/dL (ref >39)
LDL Chol Calc (NIH): 61 mg/dL (ref 0–99)
Triglycerides: 82 mg/dL (ref 0–149)
VLDL Cholesterol Cal: 16 mg/dL (ref 5–40)

## 2020-11-21 LAB — TSH: TSH: 1.77 u[IU]/mL (ref 0.450–4.500)

## 2020-12-01 DIAGNOSIS — R000 Tachycardia, unspecified: Secondary | ICD-10-CM

## 2020-12-01 DIAGNOSIS — I498 Other specified cardiac arrhythmias: Secondary | ICD-10-CM | POA: Diagnosis not present

## 2020-12-01 DIAGNOSIS — I10 Essential (primary) hypertension: Secondary | ICD-10-CM | POA: Diagnosis not present

## 2020-12-01 DIAGNOSIS — Z7982 Long term (current) use of aspirin: Secondary | ICD-10-CM

## 2020-12-01 DIAGNOSIS — I2510 Atherosclerotic heart disease of native coronary artery without angina pectoris: Secondary | ICD-10-CM

## 2020-12-01 DIAGNOSIS — E782 Mixed hyperlipidemia: Secondary | ICD-10-CM

## 2020-12-01 HISTORY — DX: Tachycardia, unspecified: R00.0

## 2020-12-01 HISTORY — DX: Atherosclerotic heart disease of native coronary artery without angina pectoris: I25.10

## 2020-12-01 HISTORY — DX: Mixed hyperlipidemia: E78.2

## 2020-12-01 HISTORY — DX: Long term (current) use of aspirin: Z79.82

## 2020-12-03 DIAGNOSIS — H353221 Exudative age-related macular degeneration, left eye, with active choroidal neovascularization: Secondary | ICD-10-CM | POA: Diagnosis not present

## 2020-12-09 DIAGNOSIS — J3489 Other specified disorders of nose and nasal sinuses: Secondary | ICD-10-CM | POA: Diagnosis not present

## 2020-12-09 DIAGNOSIS — J302 Other seasonal allergic rhinitis: Secondary | ICD-10-CM | POA: Diagnosis not present

## 2020-12-22 ENCOUNTER — Ambulatory Visit: Payer: PPO | Admitting: Cardiology

## 2020-12-29 DIAGNOSIS — G8929 Other chronic pain: Secondary | ICD-10-CM | POA: Insufficient documentation

## 2020-12-29 DIAGNOSIS — M1712 Unilateral primary osteoarthritis, left knee: Secondary | ICD-10-CM | POA: Diagnosis not present

## 2020-12-29 DIAGNOSIS — M25562 Pain in left knee: Secondary | ICD-10-CM | POA: Diagnosis not present

## 2020-12-29 DIAGNOSIS — Z96651 Presence of right artificial knee joint: Secondary | ICD-10-CM | POA: Diagnosis not present

## 2020-12-29 HISTORY — DX: Other chronic pain: G89.29

## 2021-01-20 DIAGNOSIS — I10 Essential (primary) hypertension: Secondary | ICD-10-CM | POA: Diagnosis not present

## 2021-01-20 DIAGNOSIS — Z6829 Body mass index (BMI) 29.0-29.9, adult: Secondary | ICD-10-CM | POA: Diagnosis not present

## 2021-01-20 DIAGNOSIS — R7301 Impaired fasting glucose: Secondary | ICD-10-CM | POA: Diagnosis not present

## 2021-01-20 DIAGNOSIS — E785 Hyperlipidemia, unspecified: Secondary | ICD-10-CM | POA: Diagnosis not present

## 2021-01-20 DIAGNOSIS — E039 Hypothyroidism, unspecified: Secondary | ICD-10-CM | POA: Diagnosis not present

## 2021-01-20 DIAGNOSIS — M1712 Unilateral primary osteoarthritis, left knee: Secondary | ICD-10-CM | POA: Diagnosis not present

## 2021-01-20 DIAGNOSIS — K222 Esophageal obstruction: Secondary | ICD-10-CM | POA: Diagnosis not present

## 2021-01-20 DIAGNOSIS — K219 Gastro-esophageal reflux disease without esophagitis: Secondary | ICD-10-CM | POA: Diagnosis not present

## 2021-01-28 DIAGNOSIS — N401 Enlarged prostate with lower urinary tract symptoms: Secondary | ICD-10-CM | POA: Diagnosis not present

## 2021-01-28 DIAGNOSIS — N411 Chronic prostatitis: Secondary | ICD-10-CM | POA: Diagnosis not present

## 2021-02-04 DIAGNOSIS — H353221 Exudative age-related macular degeneration, left eye, with active choroidal neovascularization: Secondary | ICD-10-CM | POA: Diagnosis not present

## 2021-02-08 DIAGNOSIS — K219 Gastro-esophageal reflux disease without esophagitis: Secondary | ICD-10-CM | POA: Diagnosis not present

## 2021-02-08 DIAGNOSIS — K222 Esophageal obstruction: Secondary | ICD-10-CM | POA: Diagnosis not present

## 2021-02-08 DIAGNOSIS — J3489 Other specified disorders of nose and nasal sinuses: Secondary | ICD-10-CM | POA: Diagnosis not present

## 2021-02-12 DIAGNOSIS — C44329 Squamous cell carcinoma of skin of other parts of face: Secondary | ICD-10-CM | POA: Diagnosis not present

## 2021-02-12 DIAGNOSIS — D485 Neoplasm of uncertain behavior of skin: Secondary | ICD-10-CM | POA: Diagnosis not present

## 2021-02-12 DIAGNOSIS — L570 Actinic keratosis: Secondary | ICD-10-CM | POA: Diagnosis not present

## 2021-02-22 DIAGNOSIS — R000 Tachycardia, unspecified: Secondary | ICD-10-CM | POA: Diagnosis not present

## 2021-02-22 DIAGNOSIS — R0902 Hypoxemia: Secondary | ICD-10-CM | POA: Diagnosis not present

## 2021-02-22 DIAGNOSIS — R079 Chest pain, unspecified: Secondary | ICD-10-CM | POA: Diagnosis not present

## 2021-02-23 ENCOUNTER — Other Ambulatory Visit: Payer: Self-pay

## 2021-02-23 ENCOUNTER — Ambulatory Visit: Payer: PPO | Admitting: Cardiology

## 2021-02-23 VITALS — BP 138/66 | HR 97 | Ht 69.0 in | Wt 199.0 lb

## 2021-02-23 DIAGNOSIS — I2510 Atherosclerotic heart disease of native coronary artery without angina pectoris: Secondary | ICD-10-CM | POA: Diagnosis not present

## 2021-02-23 DIAGNOSIS — I10 Essential (primary) hypertension: Secondary | ICD-10-CM

## 2021-02-23 DIAGNOSIS — E782 Mixed hyperlipidemia: Secondary | ICD-10-CM | POA: Diagnosis not present

## 2021-02-23 DIAGNOSIS — N289 Disorder of kidney and ureter, unspecified: Secondary | ICD-10-CM

## 2021-02-23 DIAGNOSIS — I4729 Other ventricular tachycardia: Secondary | ICD-10-CM | POA: Diagnosis not present

## 2021-02-23 NOTE — Patient Instructions (Signed)
Medication Instructions:  Your physician recommends that you continue on your current medications as directed. Please refer to the Current Medication list given to you today.  *If you need a refill on your cardiac medications before your next appointment, please call your pharmacy*   Lab Work: None ordered If you have labs (blood work) drawn today and your tests are completely normal, you will receive your results only by: MyChart Message (if you have MyChart) OR A paper copy in the mail If you have any lab test that is abnormal or we need to change your treatment, we will call you to review the results.   Testing/Procedures: None ordered   Follow-Up: At [facility], you and your health needs are our priority.  As part of our continuing mission to provide you with exceptional heart care, we have created designated Provider Care Teams.  These Care Teams include your primary Cardiologist (physician) and Advanced Practice Providers (APPs -  Physician Assistants and Nurse Practitioners) who all work together to provide you with the care you need, when you need it.  We recommend signing up for the patient portal called "MyChart".  Sign up information is provided on this After Visit Summary.  MyChart is used to connect with patients for Virtual Visits (Telemedicine).  Patients are able to view lab/test results, encounter notes, upcoming appointments, etc.  Non-urgent messages can be sent to your provider as well.   To learn more about what you can do with MyChart, go to [email or web address].    Your next appointment:   1 month(s)  The format for your next appointment:   In Person  Provider:   Rajan Revankar, MD   Other Instructions NA  

## 2021-02-23 NOTE — Progress Notes (Addendum)
Cardiology Office Note:    Date:  02/23/2021   ID:  Duane Hamilton, DOB Feb 24, 1946, MRN [id number]  PCP:  Nicoletta Dress, MD  Cardiologist:  Jenean Lindau, MD   Referring MD: Nicoletta Dress, MD    ASSESSMENT:    1. Coronary artery disease involving native coronary artery of native heart without angina pectoris   2. Essential hypertension   3. Mixed hyperlipidemia   4. Nonsustained ventricular tachycardia   5. Renal insufficiency, mild    PLAN:    In order of problems listed above:  Coronary artery disease: Secondary prevention stressed with the patient.  Importance of compliance with diet medication stressed any vocalized understanding.  He was advised to walk at least half an hour a day 5 days a week and he vocalized understanding.  Emergency room records were reviewed and they were discussed with him and questions were answered to his satisfaction. Essential hypertension: Blood pressure stable and diet was emphasized.  Lifestyle modification urged. Mixed dyslipidemia: On statin therapy.  Lipids followed by primary care. Will be seen in follow-up appointment in a month or earlier if he has any concerns.  He tells me that ambulation does not bring around the symptoms.    Medication Adjustments/Labs and Tests Ordered: Current medicines are reviewed at length with the patient today.  Concerns regarding medicines are outlined above.  No orders of the defined types were placed in this encounter.  No orders of the defined types were placed in this encounter.    No chief complaint on file.    History of Present Illness:    Duane Hamilton is a 75 y.o. male.  Patient has past medical history of nonobstructive coronary artery disease, essential hypertension and dyslipidemia.  He leads a sedentary lifestyle.  He has mild renal insufficiency.  He denies any chest pain orthopnea or PND.  He went to the emergency room and tells me that his heart rate was elevated.  I  reviewed emergency room records and they were unremarkable.  No chest pain orthopnea or PND at the current time.  At the time of my evaluation, the patient is alert awake oriented and in no distress.  He tells me that he took isosorbide this morning and could not tolerate it.  He felt a little dizzy and uneasy.  He is very comfortable now.  He does not want to take the medication again and I respect his wishes.  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    azelastine (ASTELIN) 0.1 % nasal spray  Place 2 sprays into both nostrils daily as needed for rhinitis or allergies.    cetirizine (ZYRTEC) 10 MG tablet Take 10 mg by mouth at bedtime.   famotidine (PEPCID) 40 MG tablet Take 40 mg by mouth daily.   finasteride (PROSCAR) 5 MG tablet Take 5 mg by mouth daily.   isosorbide dinitrate (ISORDIL) 30 MG tablet Take 30 mg by mouth daily.   levothyroxine (SYNTHROID, LEVOTHROID) 75 MCG tablet Take 75 mcg by mouth daily before breakfast.    metoprolol succinate (TOPROL-XL) 50 MG 24 hr tablet Take 50 mg  by mouth daily.    nitroGLYCERIN (NITROSTAT) 0.4 MG SL tablet Place 0.4 mg under the tongue every 5 (five) minutes as needed for chest pain.   pantoprazole (PROTONIX) 40 MG tablet Take 40 mg by mouth daily.   simvastatin (ZOCOR) 40 MG tablet Take 40 mg by mouth daily at 6 PM.    tamsulosin (FLOMAX) 0.4 MG CAPS capsule Take 0.4 mg by mouth daily.      Allergies:   Lisinopril and Sulfa antibiotics   Social History   Socioeconomic History   Marital status: Married    Spouse name: Not on file   Number of children: Not on file   Years of education: Not on file   Highest education level: Not on file  Occupational History   Not on file  Tobacco Use   Smoking status: Never   Smokeless tobacco: Never  Vaping Use   Vaping Use: Never used  Substance and Sexual Activity   Alcohol use: No   Drug use: No   Sexual activity: Not on file  Other Topics Concern   Not on file  Social History Narrative   Not on file   Social Determinants of Health   Financial Resource Strain: Not on file  Food Insecurity: Not on file  Transportation Needs: Not on file  Physical Activity: Not on file  Stress: Not on file  Social Connections: Not on file     Family History: The patient's family history includes Atrial fibrillation in his father; Cancer in his mother; Heart disease in his sister; Hypertension in his mother.  ROS:   Please see the history of present illness.    All other systems reviewed and are negative.  EKGs/Labs/Other Studies Reviewed:    The following studies were reviewed today: I discussed the findings after I reviewed records from the emergency room. EKG reveals sinus rhythm with nonspecific ST-T changes.   Recent Labs: 11/20/2020: ALT 14; BUN 12; Creatinine, Ser 1.30; Hemoglobin 14.6; Platelets 147; Potassium 4.5; Sodium 138; TSH 1.770  Recent Lipid Panel    Component Value Date/Time   CHOL 119 11/20/2020 0823   TRIG 82 11/20/2020 0823   HDL 42 11/20/2020 0823   CHOLHDL  2.8 11/20/2020 0823   LDLCALC 61 11/20/2020 0823    Physical Exam:    VS:  BP 138/66   Pulse 97   Ht 5\' 9"  (1.753 m)   Wt 199 lb (90.3 kg)   SpO2 97%   BMI 29.39 kg/m     Wt Readings from Last 3 Encounters:  02/23/21 199 lb (90.3 kg)  11/11/20 197 lb (89.4 kg)  11/06/20 197 lb 3.2 oz (89.4 kg)     GEN: Patient is in no acute distress HEENT: Normal NECK: No JVD; No carotid bruits LYMPHATICS: No lymphadenopathy CARDIAC: Hear sounds regular, 2/6 systolic murmur at the apex. RESPIRATORY:  Clear to auscultation without rales, wheezing or rhonchi  ABDOMEN: Soft,  non-tender, non-distended MUSCULOSKELETAL:  No edema; No deformity  SKIN: Warm and dry NEUROLOGIC:  Alert and oriented x 3 PSYCHIATRIC:  Normal affect   Signed, Jenean Lindau, MD  02/23/2021 1:49 PM    [facility]

## 2021-02-24 ENCOUNTER — Ambulatory Visit: Payer: PPO | Admitting: Cardiology

## 2021-03-04 DIAGNOSIS — J069 Acute upper respiratory infection, unspecified: Secondary | ICD-10-CM | POA: Diagnosis not present

## 2021-03-24 ENCOUNTER — Ambulatory Visit: Payer: PPO | Admitting: Cardiology

## 2021-04-08 DIAGNOSIS — H353221 Exudative age-related macular degeneration, left eye, with active choroidal neovascularization: Secondary | ICD-10-CM | POA: Diagnosis not present

## 2021-04-17 IMAGING — CT CT ANGIO CHEST-ABD-PELV FOR DISSECTION W/ AND WO/W CM
2 of 7 series · 14 of 46 positions shown, 16 images · IV contrast (OMNI 350)
Comparison: Chest x-ray 01/23/2019, CT 12/03/2017, 04/29/2014

CLINICAL DATA: Chest pain

EXAM:
CT ANGIOGRAPHY CHEST, ABDOMEN AND PELVIS
TECHNIQUE: Multidetector CT imaging through the chest, abdomen and pelvis was
performed using the standard protocol during bolus administration of
intravenous contrast. Multiplanar reconstructed images and MIPs were
obtained and reviewed to evaluate the vascular anatomy.
CONTRAST:  100mL OMNIPAQUE IOHEXOL 350 MG/ML SOLN

[Series 7: dissection 2mm · axial · 0.86mm/px · z∈[+854,+1414]mm · 11 of 314 slices shown, 13 images]
[im 17/314  soft-tissue]
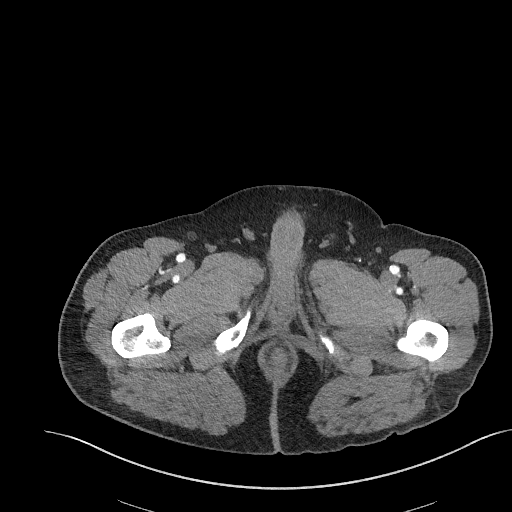
[im 17/314  bone]
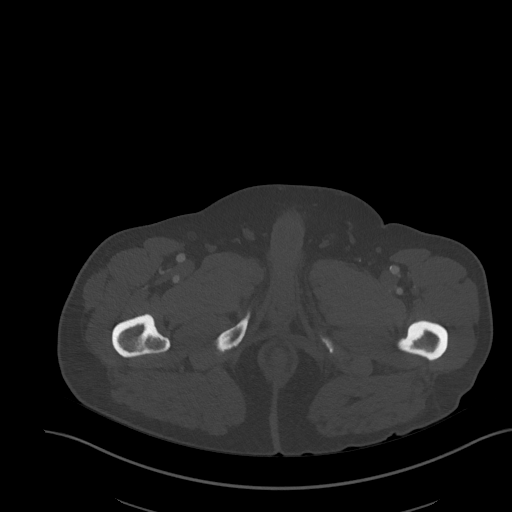
[im 50/314  soft-tissue]
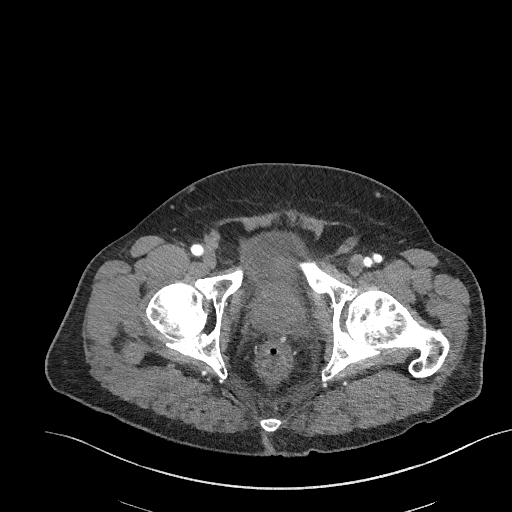
[im 83/314  soft-tissue]
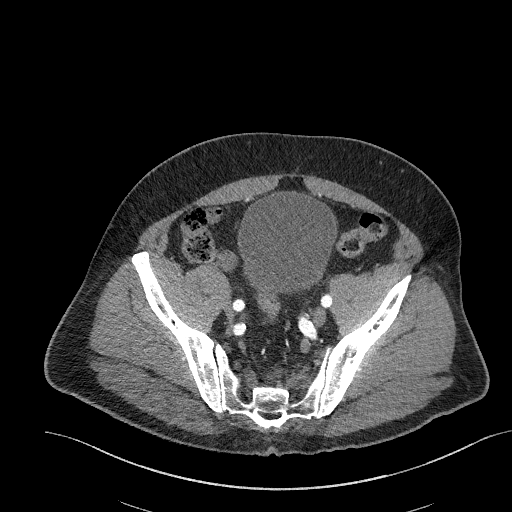
[im 99/314  soft-tissue]
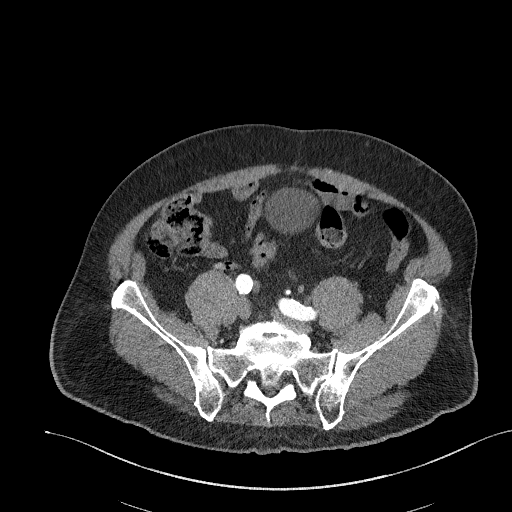
[im 132/314  soft-tissue]
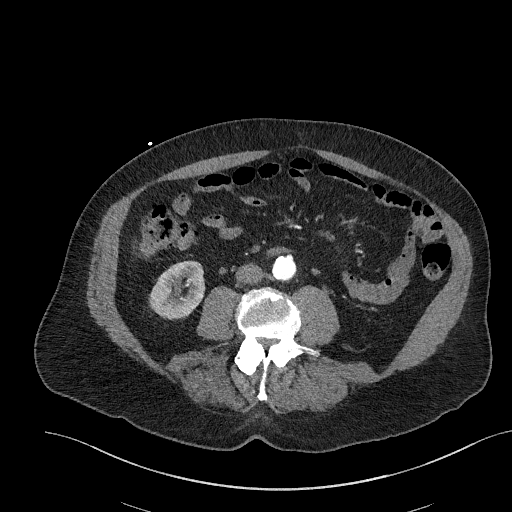
[im 165/314  soft-tissue]
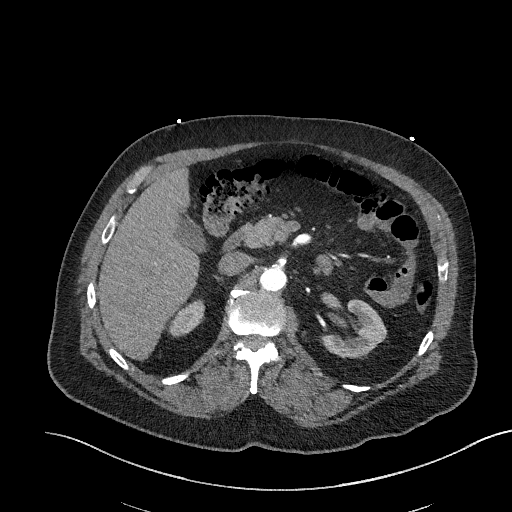
[im 182/314  soft-tissue]
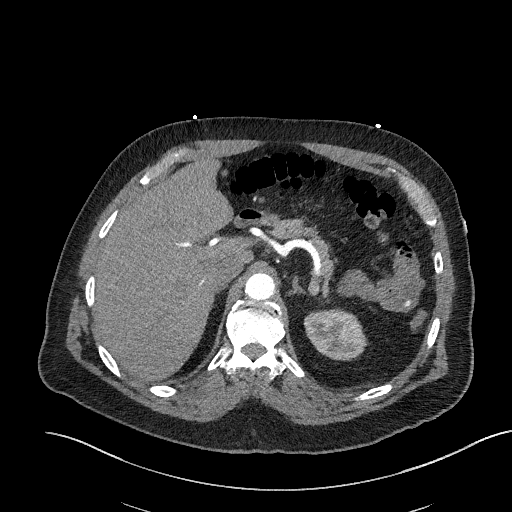
[im 215/314  soft-tissue]
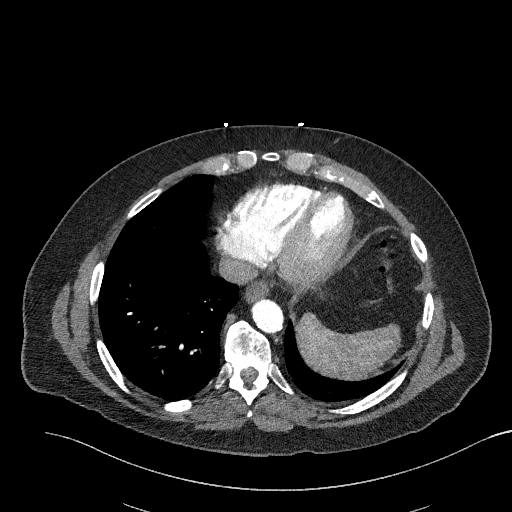
[im 231/314  soft-tissue]
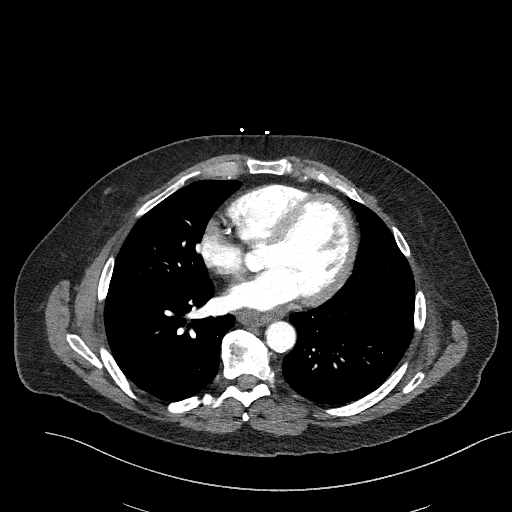
[im 231/314  bone]
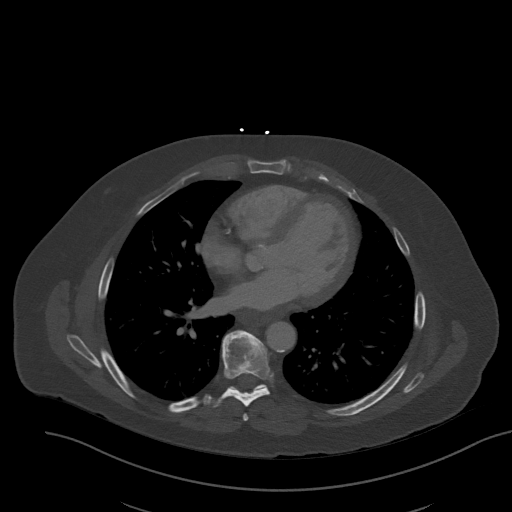
[im 264/314  soft-tissue]
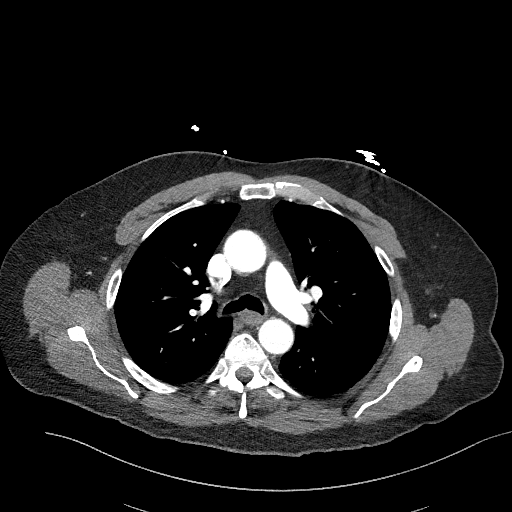
[im 297/314  soft-tissue]
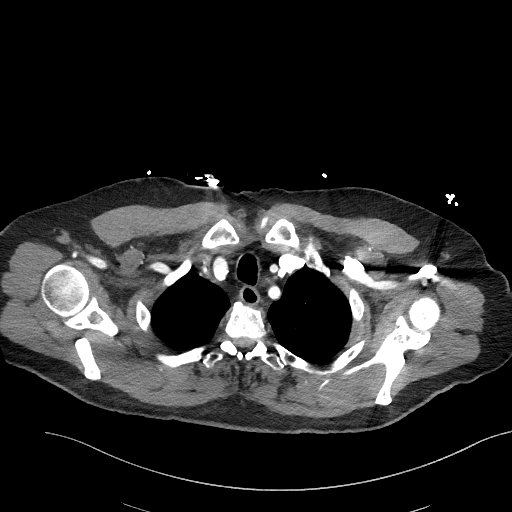

[Series 10: dissection 2mm cor · coronal · 0.77mm/px · 3 of 144 slices shown]
[im 36/144  soft-tissue]
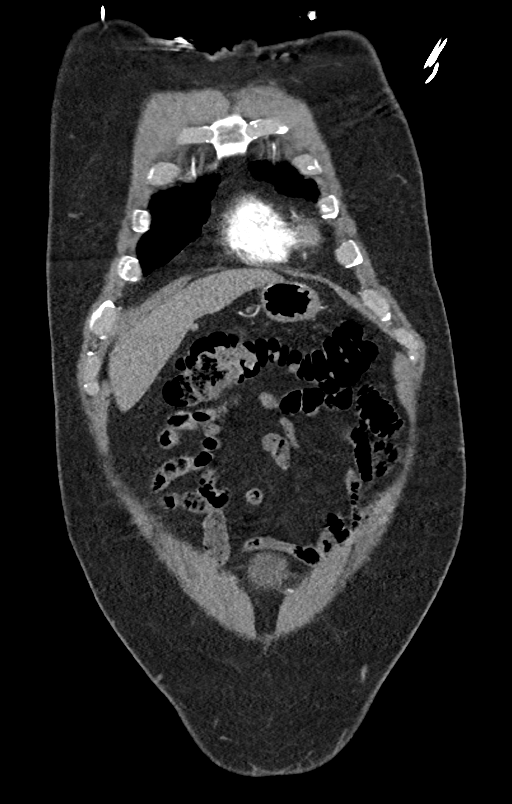
[im 72/144  soft-tissue]
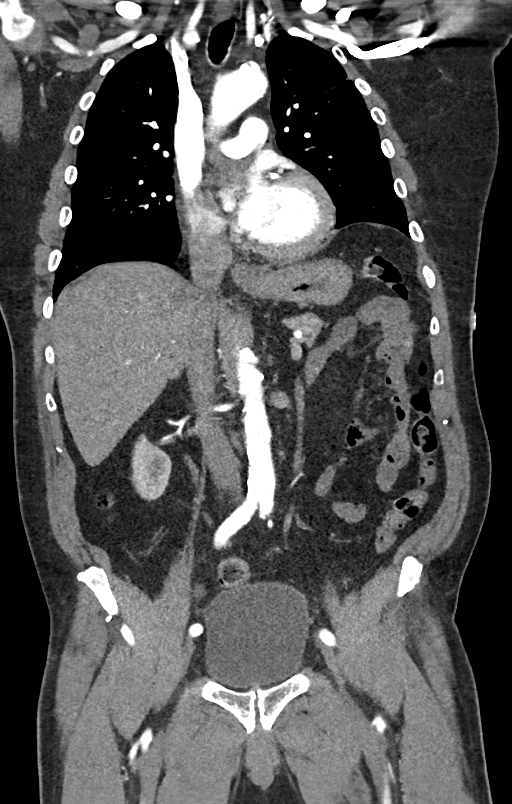
[im 108/144  soft-tissue]
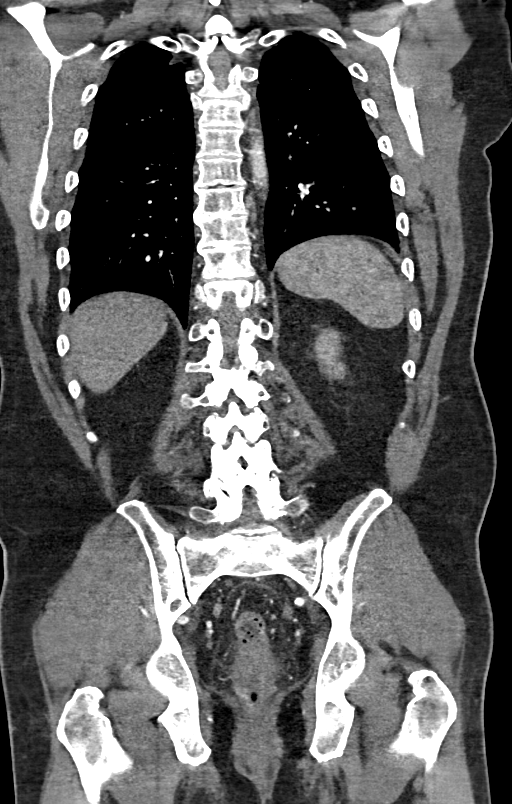

[14 of 46 positions shown; findings below may reference images not displayed]

FINDINGS: CTA CHEST FINDINGS

Cardiovascular: Non contrasted images of the chest demonstrate no
acute intramural hematoma. Moderate aortic atherosclerosis. No
aneurysmal dilatation. No dissection is seen. No central filling
defects within the pulmonary vasculature. Coronary vascular
calcification. Normal heart size. No pericardial effusion.

Mediastinum/Nodes: Midline trachea. No thyroid mass. No
significantly enlarged lymph nodes. Esophagus within normal limits.

Lungs/Pleura: No acute consolidation, pleural effusion or
pneumothorax. Stable small foci of scarring at the left apex and
subpleural left upper lobe.

Musculoskeletal: No acute osseous abnormality. Degenerative changes
of the spine.

Review of the MIP images confirms the above findings.

CTA ABDOMEN AND PELVIS FINDINGS

VASCULAR

Aorta: Nonaneurysmal. Moderate aortic atherosclerosis. No dissection
seen. No acute occlusive disease.

Celiac: Bulky calcification at the origin may result in mild
narrowing. Widely patent distal vessel.

SMA: Mild atherosclerosis at the origin. No significant stenosis.
Distal vessel patency

Renals: Single right and single left renal arteries. Mild
calcifications at the origin, but no high-grade stenosis.

IMA: Patent without evidence of aneurysm, dissection, vasculitis or
significant stenosis.

Inflow: Patent without evidence of aneurysm, dissection, vasculitis
or significant stenosis. Moderate aortic atherosclerosis.

Veins: No obvious venous abnormality within the limitations of this
arterial phase study.

Review of the MIP images confirms the above findings.

NON-VASCULAR

Hepatobiliary: No focal liver abnormality is seen. No gallstones,
gallbladder wall thickening, or biliary dilatation.

Pancreas: Unremarkable. No pancreatic ductal dilatation or
surrounding inflammatory changes.

Spleen: Normal in size without focal abnormality.

Adrenals/Urinary Tract: Adrenal glands are unremarkable. Kidneys are
normal, without renal calculi, focal lesion, or hydronephrosis.
Bladder is unremarkable.

Stomach/Bowel: Stomach is within normal limits. Appendix appears
normal. No evidence of bowel wall thickening, distention, or
inflammatory changes. Prominent perirectal vascular enhancement.

Lymphatic: No significantly enlarged lymph nodes

Reproductive: Slightly enlarged prostate.

Other: Negative for free air or free fluid. Minimal presacral
edema/stranding.

Musculoskeletal: No acute osseous abnormality. Suspicion of cortical
bone thickening in the pelvis. Prominent facet sclerotic disease,
chronic findings.

Review of the MIP images confirms the above findings.
IMPRESSION: 1. Negative for acute aortic dissection or aneurysm.
2. Atherosclerotic vascular disease of the aorta and branch vessels
but without acute occlusive disease or high-grade stenosis
3. No CT evidence for acute intra-abdominal or pelvic abnormality.

## 2021-04-17 IMAGING — DX DG CHEST 1V PORT
1 series · 1 of 1 positions shown · non-contrast
Comparison: [facility] Auth [id number]

CLINICAL DATA: Chest pain

EXAM:
PORTABLE CHEST 1 VIEW

[chest ap]
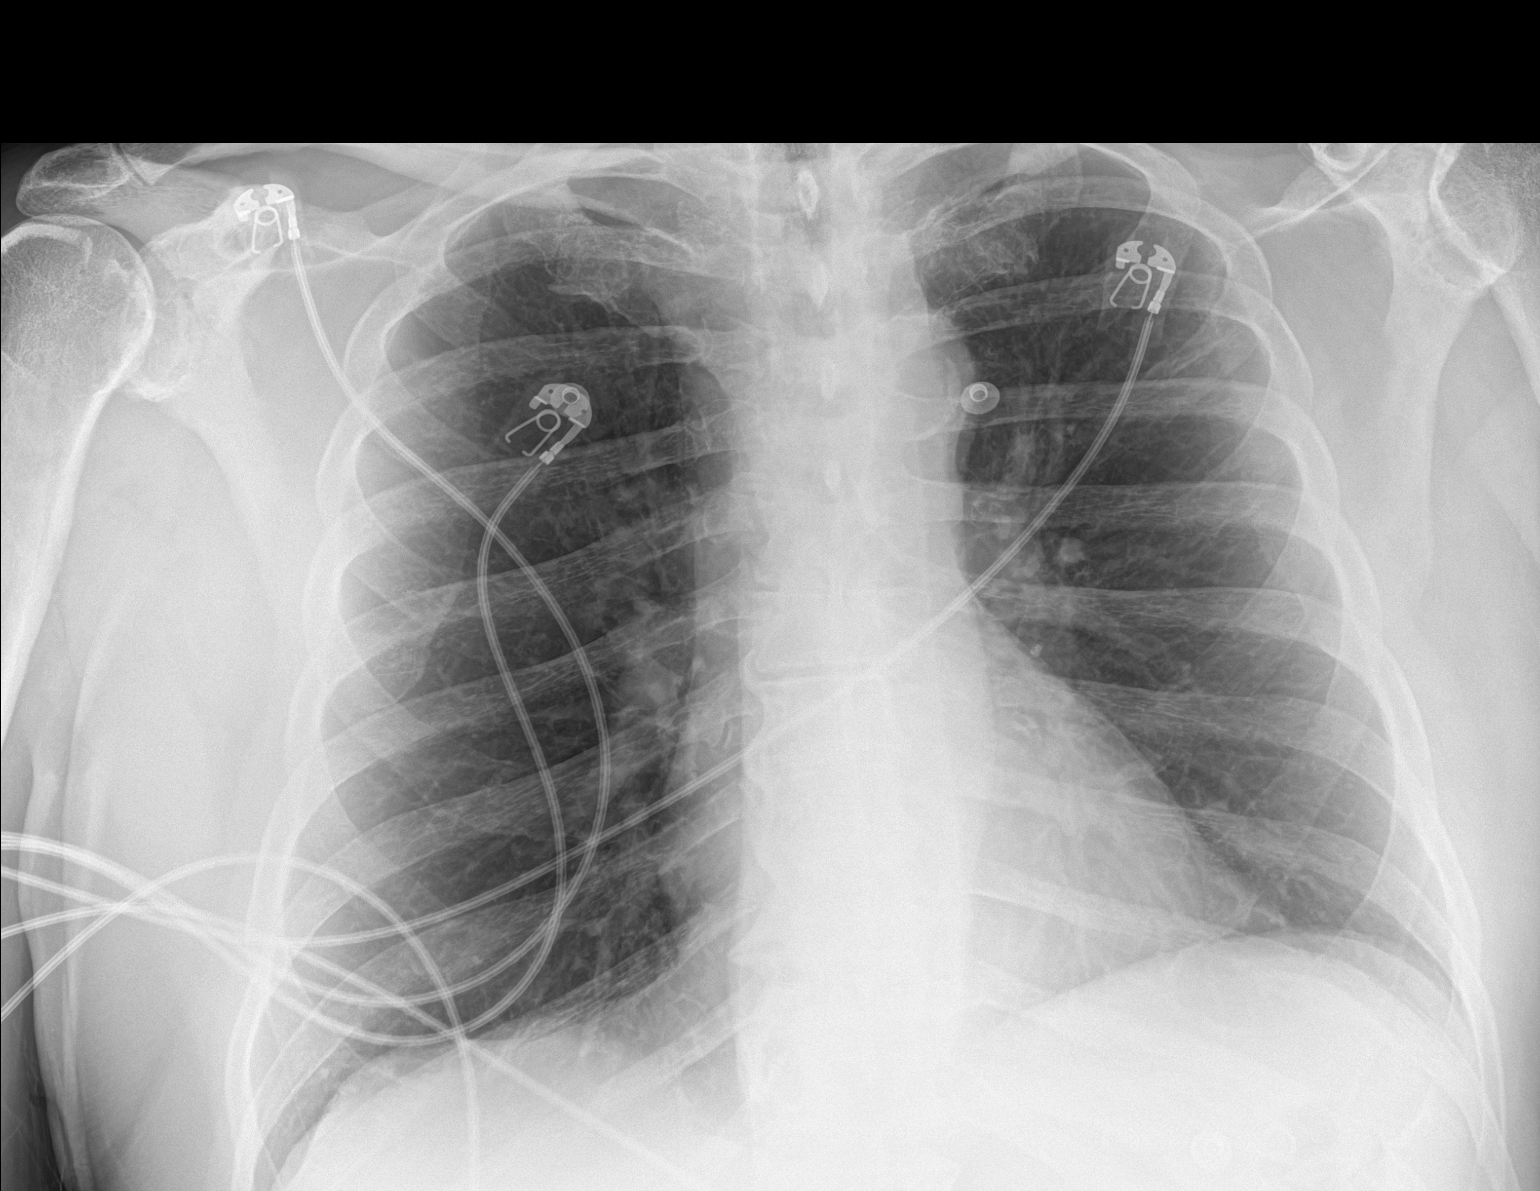

[1 of 1 positions shown; findings below may reference images not displayed]

FINDINGS: The heart size and mediastinal contours are within normal limits.
Aortic knob calcifications. Both lungs are clear. The visualized
skeletal structures are unremarkable.
IMPRESSION: No acute cardiopulmonary process.

## 2021-04-23 ENCOUNTER — Telehealth: Payer: Self-pay | Admitting: *Deleted

## 2021-04-23 NOTE — Telephone Encounter (Signed)
Spoke with pt after receiving printed out [place] report. Dr. Geraldo Pitter asked if pt could please do again due some problems reading this report. Pt verbalized understanding and said he would do another one in the next few days and send again.

## 2021-05-06 ENCOUNTER — Ambulatory Visit: Payer: PPO | Admitting: Cardiology

## 2021-05-06 DIAGNOSIS — Z9889 Other specified postprocedural states: Secondary | ICD-10-CM | POA: Diagnosis not present

## 2021-05-06 DIAGNOSIS — M1712 Unilateral primary osteoarthritis, left knee: Secondary | ICD-10-CM | POA: Diagnosis not present

## 2021-05-12 ENCOUNTER — Other Ambulatory Visit: Payer: Self-pay

## 2021-05-12 ENCOUNTER — Ambulatory Visit: Payer: PPO | Admitting: Cardiology

## 2021-05-12 ENCOUNTER — Encounter: Payer: Self-pay | Admitting: Cardiology

## 2021-05-12 ENCOUNTER — Ambulatory Visit (INDEPENDENT_AMBULATORY_CARE_PROVIDER_SITE_OTHER): Payer: PPO

## 2021-05-12 VITALS — BP 138/62 | HR 74 | Ht 69.0 in | Wt 195.0 lb

## 2021-05-12 DIAGNOSIS — I4729 Other ventricular tachycardia: Secondary | ICD-10-CM

## 2021-05-12 DIAGNOSIS — E782 Mixed hyperlipidemia: Secondary | ICD-10-CM

## 2021-05-12 DIAGNOSIS — I10 Essential (primary) hypertension: Secondary | ICD-10-CM | POA: Diagnosis not present

## 2021-05-12 DIAGNOSIS — I2510 Atherosclerotic heart disease of native coronary artery without angina pectoris: Secondary | ICD-10-CM

## 2021-05-12 NOTE — Patient Instructions (Signed)
Medication Instructions:  Your physician recommends that you continue on your current medications as directed. Please refer to the Current Medication list given to you today.  *If you need a refill on your cardiac medications before your next appointment, please call your pharmacy*   Lab Work: None ordered If you have labs (blood work) drawn today and your tests are completely normal, you will receive your results only by: [place] (if you have MyChart) OR A paper copy in the mail If you have any lab test that is abnormal or we need to change your treatment, we will call you to review the results.   Testing/Procedures:  WHY IS MY DOCTOR PRESCRIBING ZIO? The Zio system is proven and trusted by physicians to detect and diagnose irregular heart rhythms -- and has been prescribed to hundreds of thousands of patients.  The FDA has cleared the Zio system to monitor for many different kinds of irregular heart rhythms. In a study, physicians were able to reach a diagnosis 90% of the time with the Zio system1.  You can wear the Zio monitor -- a small, discreet, comfortable patch -- during your normal day-to-day activity, including while you sleep, shower, and exercise, while it records every single heartbeat for analysis.  1Barrett, P., et al. Comparison of 24 Hour Holter Monitoring Versus 14 Day Novel Adhesive Patch Electrocardiographic Monitoring. [place], 2014.  ZIO VS. HOLTER MONITORING The Zio monitor can be comfortably worn for up to 14 days. Holter monitors can be worn for 24 to 48 hours, limiting the time to record any irregular heart rhythms you may have. Zio is able to capture data for the 51% of patients who have their first symptom-triggered arrhythmia after 48 hours.1  LIVE WITHOUT RESTRICTIONS The Zio ambulatory cardiac monitor is a small, unobtrusive, and water-resistant patch--you might even forget youre wearing it. The Zio monitor records and stores  every beat of your heart, whether you're sleeping, working out, or showering. Wear the monitor for 14 days, remove 05/26/21.   Follow-Up: At [facility], you and your health needs are our priority.  As part of our continuing mission to provide you with exceptional heart care, we have created designated Provider Care Teams.  These Care Teams include your primary Cardiologist (physician) and Advanced Practice Providers (APPs -  Physician Assistants and Nurse Practitioners) who all work together to provide you with the care you need, when you need it.  We recommend signing up for the patient portal called "MyChart".  Sign up information is provided on this After Visit Summary.  MyChart is used to connect with patients for Virtual Visits (Telemedicine).  Patients are able to view lab/test results, encounter notes, upcoming appointments, etc.  Non-urgent messages can be sent to your provider as well.   To learn more about what you can do with MyChart, go to [email or web address].    Your next appointment:   9 month(s)  The format for your next appointment:   In Person  Provider:   Jyl Heinz, MD   Other Instructions NA

## 2021-05-12 NOTE — Progress Notes (Addendum)
Cardiology Office Note:    Date:  05/12/2021   ID:  Duane Hamilton, DOB [birth year], MRN [id number]  PCP:  Nicoletta Dress, MD  Cardiologist:  Jenean Lindau, MD   Referring MD: Nicoletta Dress, MD    ASSESSMENT:    1. Chronic coronary artery disease   2. Essential hypertension   3. Mixed hyperlipidemia   4. Nonsustained ventricular tachycardia    PLAN:    In order of problems listed above:  Preoperative restratification: Patient has good effort tolerance.  He has had a stress test which was unremarkable during the second part of last year.  Based on this he is not at high risk for coronary events during the aforementioned surgery.  Medical hemodynamic monitoring will further reduce the risk of coronary events. Essential hypertension: Blood pressure stable and diet was emphasized.  Lifestyle modification urged. Mixed dyslipidemia: Lipids followed by primary care.  KPN sheet reveals lipids to be unremarkable. Coronary artery disease: Stable at this time and asymptomatic.   Obesity: Weight reduction was stressed and diet emphasized.  Risks of obesity stressed and he promises to do better. Patient will be seen in follow-up appointment in 6 months or earlier if the patient has any concerns   Addendum: He complains of skipped beat and palpitation like symptoms.  His Kardia app tracings were unremarkable but is a poor baseline so we will do a 14-day monitor 2-week ZIO monitoring.   Medication Adjustments/Labs and Tests Ordered: Current medicines are reviewed at length with the patient today.  Concerns regarding medicines are outlined above.  No orders of the defined types were placed in this encounter.  No orders of the defined types were placed in this encounter.    Chief Complaint  Patient presents with   Follow-up     History of Present Illness:    Duane Hamilton is a 76 y.o. male.  Patient has past medical history of coronary artery disease, essential  hypertension, dyslipidemia and obesity.  He is planning to undergo knee replacement surgery.  Later part of last year he had stress test which was unremarkable.  He still golfs and has no issues with it.  No chest pain orthopnea or PND.  At the time of my evaluation, the patient is alert awake oriented and in no distress.  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    azelastine (ASTELIN) 0.1 % nasal spray Place 2 sprays into both nostrils daily as needed for rhinitis or allergies.    cetirizine (ZYRTEC) 10 MG tablet Take 10  mg by mouth at bedtime.   famotidine (PEPCID) 40 MG tablet Take 40 mg by mouth daily.   finasteride (PROSCAR) 5 MG tablet Take 5 mg by mouth daily.   isosorbide dinitrate (ISORDIL) 30 MG tablet Take 30 mg by mouth daily.   levothyroxine (SYNTHROID, LEVOTHROID) 75 MCG tablet Take 75 mcg by mouth daily before breakfast.    metoprolol succinate (TOPROL-XL) 50 MG 24 hr tablet Take 50 mg by mouth daily.    nitroGLYCERIN (NITROSTAT) 0.4 MG SL tablet Place 0.4 mg under the tongue  every 5 (five) minutes as needed for chest pain.   pantoprazole (PROTONIX) 40 MG tablet Take 40 mg by mouth daily.   simvastatin (ZOCOR) 40 MG tablet Take 40 mg by mouth daily at 6 PM.    tamsulosin (FLOMAX) 0.4 MG CAPS capsule Take 0.4 mg by mouth daily.      Allergies:   Lisinopril and Sulfa antibiotics   Social History   Socioeconomic History   Marital status: Married    Spouse name: Not on file   Number of children: Not on file   Years of education: Not on file   Highest education level: Not on file  Occupational History   Not on file  Tobacco Use   Smoking status: Never   Smokeless tobacco: Never  Vaping Use   Vaping Use: Never used  Substance and Sexual Activity   Alcohol use: No   Drug use: No   Sexual activity: Not on file  Other Topics Concern   Not on file  Social History Narrative   Not on file   Social Determinants of Health   Financial Resource Strain: Not on file  Food Insecurity: Not on file  Transportation Needs: Not on file  Physical Activity: Not on file  Stress: Not on file  Social Connections: Not on file     Family History: The patient's family history includes Atrial fibrillation in his father; Cancer in his mother; Heart disease in his sister; Hypertension in his mother.  ROS:   Please see the history of present illness.    All other systems reviewed and are negative.  EKGs/Labs/Other Studies Reviewed:    The following studies were reviewed today: Study Highlights  Nuclear stress EF: 65%. The left ventricular ejection fraction is normal (55-65%). There was no ST segment deviation noted during stress. This is a low risk study.     Recent Labs: 11/20/2020: ALT 14; BUN 12; Creatinine, Ser 1.30; Hemoglobin 14.6; Platelets 147; Potassium 4.5; Sodium 138; TSH 1.770  Recent Lipid Panel    Component Value Date/Time   CHOL 119 11/20/2020 0823   TRIG 82 11/20/2020 0823   HDL 42 11/20/2020 0823   CHOLHDL 2.8 11/20/2020 0823   LDLCALC 61  11/20/2020 0823    Physical Exam:    VS:  Ht 5\' 9"  (1.753 m)    Wt 195 lb (88.5 kg)    BMI 28.80 kg/m     Wt Readings from Last 3 Encounters:  05/12/21 195 lb (88.5 kg)  02/23/21 199 lb (90.3 kg)  11/11/20 197 lb (89.4 kg)     GEN: Patient is in no acute distress HEENT: Normal NECK: No JVD; No carotid bruits LYMPHATICS: No lymphadenopathy CARDIAC: Hear sounds regular, 2/6 systolic murmur at the apex. RESPIRATORY:  Clear to auscultation without rales, wheezing or rhonchi  ABDOMEN: Soft, non-tender, non-distended MUSCULOSKELETAL:  No edema; No deformity  SKIN: Warm and dry NEUROLOGIC:  Alert and oriented x 3 PSYCHIATRIC:  Normal  affect   Signed, Jenean Lindau, MD  05/12/2021 1:14 PM    [facility]

## 2021-05-13 DIAGNOSIS — H353221 Exudative age-related macular degeneration, left eye, with active choroidal neovascularization: Secondary | ICD-10-CM | POA: Diagnosis not present

## 2021-05-13 DIAGNOSIS — H25813 Combined forms of age-related cataract, bilateral: Secondary | ICD-10-CM | POA: Diagnosis not present

## 2021-05-25 DIAGNOSIS — Z419 Encounter for procedure for purposes other than remedying health state, unspecified: Secondary | ICD-10-CM | POA: Diagnosis not present

## 2021-05-27 DIAGNOSIS — N401 Enlarged prostate with lower urinary tract symptoms: Secondary | ICD-10-CM | POA: Diagnosis not present

## 2021-05-27 DIAGNOSIS — N411 Chronic prostatitis: Secondary | ICD-10-CM | POA: Diagnosis not present

## 2021-05-31 DIAGNOSIS — I4729 Other ventricular tachycardia: Secondary | ICD-10-CM | POA: Diagnosis not present

## 2021-06-10 DIAGNOSIS — N401 Enlarged prostate with lower urinary tract symptoms: Secondary | ICD-10-CM | POA: Diagnosis not present

## 2021-06-10 DIAGNOSIS — N411 Chronic prostatitis: Secondary | ICD-10-CM | POA: Diagnosis not present

## 2021-06-11 DIAGNOSIS — Z0000 Encounter for general adult medical examination without abnormal findings: Secondary | ICD-10-CM | POA: Diagnosis not present

## 2021-06-11 DIAGNOSIS — E669 Obesity, unspecified: Secondary | ICD-10-CM | POA: Diagnosis not present

## 2021-06-11 DIAGNOSIS — Z1331 Encounter for screening for depression: Secondary | ICD-10-CM | POA: Diagnosis not present

## 2021-06-11 DIAGNOSIS — Z9181 History of falling: Secondary | ICD-10-CM | POA: Diagnosis not present

## 2021-06-11 DIAGNOSIS — R600 Localized edema: Secondary | ICD-10-CM | POA: Diagnosis not present

## 2021-06-11 DIAGNOSIS — E785 Hyperlipidemia, unspecified: Secondary | ICD-10-CM | POA: Diagnosis not present

## 2021-06-11 DIAGNOSIS — I872 Venous insufficiency (chronic) (peripheral): Secondary | ICD-10-CM | POA: Diagnosis not present

## 2021-06-11 DIAGNOSIS — Z139 Encounter for screening, unspecified: Secondary | ICD-10-CM | POA: Diagnosis not present

## 2021-06-23 DIAGNOSIS — M1712 Unilateral primary osteoarthritis, left knee: Secondary | ICD-10-CM | POA: Diagnosis not present

## 2021-06-23 DIAGNOSIS — G8918 Other acute postprocedural pain: Secondary | ICD-10-CM | POA: Diagnosis not present

## 2021-06-27 DIAGNOSIS — R609 Edema, unspecified: Secondary | ICD-10-CM | POA: Diagnosis not present

## 2021-06-27 DIAGNOSIS — R1310 Dysphagia, unspecified: Secondary | ICD-10-CM | POA: Diagnosis not present

## 2021-06-27 DIAGNOSIS — T783XXA Angioneurotic edema, initial encounter: Secondary | ICD-10-CM | POA: Diagnosis not present

## 2021-06-27 DIAGNOSIS — R509 Fever, unspecified: Secondary | ICD-10-CM | POA: Diagnosis not present

## 2021-06-27 DIAGNOSIS — T7840XA Allergy, unspecified, initial encounter: Secondary | ICD-10-CM | POA: Diagnosis not present

## 2021-07-01 DIAGNOSIS — Z96652 Presence of left artificial knee joint: Secondary | ICD-10-CM | POA: Diagnosis not present

## 2021-07-02 DIAGNOSIS — M62551 Muscle wasting and atrophy, not elsewhere classified, right thigh: Secondary | ICD-10-CM | POA: Diagnosis not present

## 2021-07-02 DIAGNOSIS — M25562 Pain in left knee: Secondary | ICD-10-CM | POA: Diagnosis not present

## 2021-07-02 DIAGNOSIS — M25462 Effusion, left knee: Secondary | ICD-10-CM | POA: Diagnosis not present

## 2021-07-02 DIAGNOSIS — R2689 Other abnormalities of gait and mobility: Secondary | ICD-10-CM | POA: Diagnosis not present

## 2021-07-02 DIAGNOSIS — Z96652 Presence of left artificial knee joint: Secondary | ICD-10-CM | POA: Diagnosis not present

## 2021-07-06 DIAGNOSIS — Z96652 Presence of left artificial knee joint: Secondary | ICD-10-CM | POA: Diagnosis not present

## 2021-07-06 DIAGNOSIS — M62551 Muscle wasting and atrophy, not elsewhere classified, right thigh: Secondary | ICD-10-CM | POA: Diagnosis not present

## 2021-07-06 DIAGNOSIS — M25562 Pain in left knee: Secondary | ICD-10-CM | POA: Diagnosis not present

## 2021-07-06 DIAGNOSIS — R2689 Other abnormalities of gait and mobility: Secondary | ICD-10-CM | POA: Diagnosis not present

## 2021-07-06 DIAGNOSIS — M25462 Effusion, left knee: Secondary | ICD-10-CM | POA: Diagnosis not present

## 2021-07-13 DIAGNOSIS — M62551 Muscle wasting and atrophy, not elsewhere classified, right thigh: Secondary | ICD-10-CM | POA: Diagnosis not present

## 2021-07-13 DIAGNOSIS — M25462 Effusion, left knee: Secondary | ICD-10-CM | POA: Diagnosis not present

## 2021-07-13 DIAGNOSIS — M25562 Pain in left knee: Secondary | ICD-10-CM | POA: Diagnosis not present

## 2021-07-13 DIAGNOSIS — R2689 Other abnormalities of gait and mobility: Secondary | ICD-10-CM | POA: Diagnosis not present

## 2021-07-13 DIAGNOSIS — Z96652 Presence of left artificial knee joint: Secondary | ICD-10-CM | POA: Diagnosis not present

## 2021-07-16 DIAGNOSIS — M25562 Pain in left knee: Secondary | ICD-10-CM | POA: Diagnosis not present

## 2021-07-16 DIAGNOSIS — R2689 Other abnormalities of gait and mobility: Secondary | ICD-10-CM | POA: Diagnosis not present

## 2021-07-16 DIAGNOSIS — M25462 Effusion, left knee: Secondary | ICD-10-CM | POA: Diagnosis not present

## 2021-07-16 DIAGNOSIS — J449 Chronic obstructive pulmonary disease, unspecified: Secondary | ICD-10-CM | POA: Diagnosis not present

## 2021-07-16 DIAGNOSIS — M62551 Muscle wasting and atrophy, not elsewhere classified, right thigh: Secondary | ICD-10-CM | POA: Diagnosis not present

## 2021-07-16 DIAGNOSIS — I10 Essential (primary) hypertension: Secondary | ICD-10-CM | POA: Diagnosis not present

## 2021-07-16 DIAGNOSIS — Z96652 Presence of left artificial knee joint: Secondary | ICD-10-CM | POA: Diagnosis not present

## 2021-07-20 DIAGNOSIS — R2689 Other abnormalities of gait and mobility: Secondary | ICD-10-CM | POA: Diagnosis not present

## 2021-07-20 DIAGNOSIS — M25562 Pain in left knee: Secondary | ICD-10-CM | POA: Diagnosis not present

## 2021-07-20 DIAGNOSIS — M62551 Muscle wasting and atrophy, not elsewhere classified, right thigh: Secondary | ICD-10-CM | POA: Diagnosis not present

## 2021-07-20 DIAGNOSIS — M25462 Effusion, left knee: Secondary | ICD-10-CM | POA: Diagnosis not present

## 2021-07-20 DIAGNOSIS — Z96652 Presence of left artificial knee joint: Secondary | ICD-10-CM | POA: Diagnosis not present

## 2021-07-21 DIAGNOSIS — M1712 Unilateral primary osteoarthritis, left knee: Secondary | ICD-10-CM | POA: Diagnosis not present

## 2021-07-21 DIAGNOSIS — K219 Gastro-esophageal reflux disease without esophagitis: Secondary | ICD-10-CM | POA: Diagnosis not present

## 2021-07-21 DIAGNOSIS — K222 Esophageal obstruction: Secondary | ICD-10-CM | POA: Diagnosis not present

## 2021-07-21 DIAGNOSIS — I10 Essential (primary) hypertension: Secondary | ICD-10-CM | POA: Diagnosis not present

## 2021-07-21 DIAGNOSIS — Z125 Encounter for screening for malignant neoplasm of prostate: Secondary | ICD-10-CM | POA: Diagnosis not present

## 2021-07-21 DIAGNOSIS — R7301 Impaired fasting glucose: Secondary | ICD-10-CM | POA: Diagnosis not present

## 2021-07-21 DIAGNOSIS — E785 Hyperlipidemia, unspecified: Secondary | ICD-10-CM | POA: Diagnosis not present

## 2021-07-21 DIAGNOSIS — N411 Chronic prostatitis: Secondary | ICD-10-CM | POA: Diagnosis not present

## 2021-07-21 DIAGNOSIS — Z6829 Body mass index (BMI) 29.0-29.9, adult: Secondary | ICD-10-CM | POA: Diagnosis not present

## 2021-07-21 DIAGNOSIS — E039 Hypothyroidism, unspecified: Secondary | ICD-10-CM | POA: Diagnosis not present

## 2021-07-22 DIAGNOSIS — Z96652 Presence of left artificial knee joint: Secondary | ICD-10-CM | POA: Diagnosis not present

## 2021-07-22 DIAGNOSIS — M25562 Pain in left knee: Secondary | ICD-10-CM | POA: Diagnosis not present

## 2021-07-22 DIAGNOSIS — R2689 Other abnormalities of gait and mobility: Secondary | ICD-10-CM | POA: Diagnosis not present

## 2021-07-22 DIAGNOSIS — M62551 Muscle wasting and atrophy, not elsewhere classified, right thigh: Secondary | ICD-10-CM | POA: Diagnosis not present

## 2021-07-22 DIAGNOSIS — M25462 Effusion, left knee: Secondary | ICD-10-CM | POA: Diagnosis not present

## 2021-07-27 DIAGNOSIS — Z96652 Presence of left artificial knee joint: Secondary | ICD-10-CM | POA: Diagnosis not present

## 2021-07-27 DIAGNOSIS — M62551 Muscle wasting and atrophy, not elsewhere classified, right thigh: Secondary | ICD-10-CM | POA: Diagnosis not present

## 2021-07-27 DIAGNOSIS — M25462 Effusion, left knee: Secondary | ICD-10-CM | POA: Diagnosis not present

## 2021-07-27 DIAGNOSIS — M25562 Pain in left knee: Secondary | ICD-10-CM | POA: Diagnosis not present

## 2021-07-27 DIAGNOSIS — R2689 Other abnormalities of gait and mobility: Secondary | ICD-10-CM | POA: Diagnosis not present

## 2021-07-30 DIAGNOSIS — Z96652 Presence of left artificial knee joint: Secondary | ICD-10-CM | POA: Diagnosis not present

## 2021-07-30 DIAGNOSIS — R2689 Other abnormalities of gait and mobility: Secondary | ICD-10-CM | POA: Diagnosis not present

## 2021-07-30 DIAGNOSIS — M25462 Effusion, left knee: Secondary | ICD-10-CM | POA: Diagnosis not present

## 2021-07-30 DIAGNOSIS — M25562 Pain in left knee: Secondary | ICD-10-CM | POA: Diagnosis not present

## 2021-07-30 DIAGNOSIS — M62551 Muscle wasting and atrophy, not elsewhere classified, right thigh: Secondary | ICD-10-CM | POA: Diagnosis not present

## 2021-08-03 DIAGNOSIS — M62551 Muscle wasting and atrophy, not elsewhere classified, right thigh: Secondary | ICD-10-CM | POA: Diagnosis not present

## 2021-08-03 DIAGNOSIS — Z96652 Presence of left artificial knee joint: Secondary | ICD-10-CM | POA: Diagnosis not present

## 2021-08-03 DIAGNOSIS — R2689 Other abnormalities of gait and mobility: Secondary | ICD-10-CM | POA: Diagnosis not present

## 2021-08-03 DIAGNOSIS — M25562 Pain in left knee: Secondary | ICD-10-CM | POA: Diagnosis not present

## 2021-08-03 DIAGNOSIS — M25462 Effusion, left knee: Secondary | ICD-10-CM | POA: Diagnosis not present

## 2021-08-04 ENCOUNTER — Encounter: Payer: Self-pay | Admitting: Cardiology

## 2021-08-04 ENCOUNTER — Ambulatory Visit: Payer: PPO | Admitting: Cardiology

## 2021-08-04 VITALS — BP 104/56 | HR 76 | Ht 69.0 in | Wt 194.4 lb

## 2021-08-04 DIAGNOSIS — I2510 Atherosclerotic heart disease of native coronary artery without angina pectoris: Secondary | ICD-10-CM | POA: Diagnosis not present

## 2021-08-04 DIAGNOSIS — I10 Essential (primary) hypertension: Secondary | ICD-10-CM | POA: Diagnosis not present

## 2021-08-04 DIAGNOSIS — E782 Mixed hyperlipidemia: Secondary | ICD-10-CM

## 2021-08-04 DIAGNOSIS — I4729 Other ventricular tachycardia: Secondary | ICD-10-CM

## 2021-08-04 NOTE — Progress Notes (Signed)
?Cardiology Office Note:   ? ?Date:  08/04/2021  ? ?ID:  [name], DOB [birth year], MRN [id number] ? ?PCP:  Nicoletta Dress, MD  ?Cardiologist:  Jenean Lindau, MD  ? ?Referring MD: Nicoletta Dress, MD  ? ? ?ASSESSMENT:   ? ?1. 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 Mixed hyperlipidemia   ?3. Essential hypertension   ?4. Coronary artery disease involving native coronary artery of native heart without angina pectoris   ? ?PLAN:   ? ?In order of problems listed above: ? ?Coronary artery disease: Secondary prevention stressed with the patient.  Importance of compliance with diet medication stressed and vocalized understanding.  He has good exercise capacity and is recovering from the surgery and getting back to exercise. ?Mixed dyslipidemia: Diet was emphasized and lifestyle modification urged.  He was advised to do better with diet for losing weight.  He agrees.  Lipids were reviewed. ?Essential hypertension: Stable blood pressure.  He is happy with it and watch his salt intake in diet and suggestions. ?Nonsustained ventricular tachycardia: These issues have resolved and he has no palpitations at this time. ?Patient will be seen in follow-up appointment in 6 months or earlier if the patient has any concerns ? ? ? ?Medication Adjustments/Labs and Tests Ordered: ?Current medicines are reviewed at length with the patient today.  Concerns regarding medicines are outlined above.  ?No orders of the defined types were placed in this encounter. ? ?No orders of the defined types were placed in this encounter. ? ? ? ?No chief complaint on file. ?  ? ?History of Present Illness:   ? ?Duane Hamilton is a 76 y.o. male.  Patient has past medical history of coronary artery disease, essential hypertension, dyslipidemia and obesity.  He underwent knee surgery and has recovered well.  He denies any chest pain orthopnea PND or palpitations.  At the time of my evaluation, the patient is alert awake oriented and in  no distress. ? ?Past Medical History:  ?Diagnosis Date  ? Aortic stenosis   ? CAD (coronary artery disease) 12/05/2017  ? Chronic coronary artery disease 12/01/2020  ? Chronic pain of left knee 12/29/2020  ? Dyslipidemia 01/13/2015  ? Esophageal stricture   ? Essential hypertension 01/13/2015  ? Hyperlipidemia   ? Long-term use of aspirin therapy 12/01/2020  ? Mixed hyperlipidemia 12/01/2020  ? 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 Palpitations   ? Rapid heartbeat 12/01/2020  ? Renal insufficiency, mild 01/13/2015  ? 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 Shortness of breath   ? ? ?Past Surgical History:  ?Procedure Laterality Date  ? LEFT HEART CATH AND CORONARY ANGIOGRAPHY N/A 01/21/2019  ? Procedure: LEFT HEART CATH AND CORONARY ANGIOGRAPHY;  Surgeon: Belva Crome, MD;  Location: [facility];  Service: Cardiovascular;  Laterality: N/A;  ? ? ?Current Medications: ?Current Meds  ?Medication Sig  ? aspirin EC 81 MG tablet Take 81 mg by mouth daily.   ? cetirizine (ZYRTEC) 10 MG tablet Take 10 mg by mouth at bedtime.  ? famotidine (PEPCID) 40 MG tablet Take 40 mg by mouth daily.  ? finasteride (PROSCAR) 5 MG tablet Take 5 mg by mouth daily.  ? furosemide (LASIX) 20 MG tablet Take 20 mg by mouth as needed for fluid or edema.  ? levothyroxine (SYNTHROID, LEVOTHROID) 75 MCG tablet Take 75 mcg by mouth daily before breakfast.   ? metoprolol succinate (TOPROL-XL) 50 MG 24 hr tablet Take 50 mg by  mouth daily.   ? nitroGLYCERIN (NITROSTAT) 0.4 MG SL tablet Place 0.4 mg under the tongue every 5 (five) minutes as needed for chest pain.  ? pantoprazole (PROTONIX) 40 MG tablet Take 40 mg by mouth daily.  ? simvastatin (ZOCOR) 40 MG tablet Take 40 mg by mouth daily at 6 PM.   ? tamsulosin (FLOMAX) 0.4 MG CAPS capsule Take 0.4 mg by mouth daily.   ?  ? ?Allergies:   Lisinopril, Sulfa antibiotics, and Tizanidine  ? ?Social History  ? ?Socioeconomic History  ? Marital status: Married  ?  Spouse  name: Not on file  ? Number of children: Not on file  ? Years of education: Not on file  ? Highest education level: Not on file  ?Occupational History  ? Not on file  ?Tobacco Use  ? Smoking status: Never  ? Smokeless tobacco: Never  ?Vaping Use  ? Vaping Use: Never used  ?Substance and Sexual Activity  ? Alcohol use: No  ? Drug use: No  ? Sexual activity: Not on file  ?Other Topics Concern  ? Not on file  ?Social History Narrative  ? Not on file  ? ?Social Determinants of Health  ? ?Financial Resource Strain: Not on file  ?Food Insecurity: Not on file  ?Transportation Needs: Not on file  ?Physical Activity: Not on file  ?Stress: Not on file  ?Social Connections: Not on file  ?  ? ?Family History: ?The patient's family history includes Atrial fibrillation in his father; Cancer in his mother; Heart disease in his sister; Hypertension in his mother. ? ?ROS:   ?Please see the history of present illness.    ?All other systems reviewed and are negative. ? ?EKGs/Labs/Other Studies Reviewed:   ? ?The following studies were reviewed today: ?I discussed findings with the patient at length including event monitor report and questions were answered to her satisfaction. ? ?Patch Wear Time:  13 days and 18 hours (2023-01-25T13:42:15-0500 to 2023-02-08T08:40:07-498) ?  ?Patient had a min HR of 50 bpm, max HR of 148 bpm, and avg HR of 72 bpm.  ?  ?Predominant underlying rhythm was Sinus Rhythm. 50 Supraventricular Tachycardia runs occurred, the run with the fastest interval lasting 5 beats with a max rate of 148 bpm, the longest lasting 12.8 secs with an avg rate of 97 bpm. Isolated SVEs were occasional (2.0%, 11914), SVE Couplets were rare (<1.0%, 351), and SVE Triplets were rare (<1.0%, 79). ?  ?Isolated VEs were rare (<1.0%, 1160), VE Couplets were rare (<1.0%, 3), and VE Triplets were rare (<1.0%, 1). Ventricular Bigeminy was present.  ?  ?Impression: Mildly abnormal but largely unremarkable monitor.  Small brief atrial runs  noted. ?  ? ? ?Recent Labs: ?11/20/2020: ALT 14; BUN 12; Creatinine, Ser 1.30; Hemoglobin 14.6; Platelets 147; Potassium 4.5; Sodium 138; TSH 1.770  ?Recent Lipid Panel ?   ?Component Value Date/Time  ? CHOL 119 11/20/2020 0823  ? TRIG 82 11/20/2020 0823  ? HDL 42 11/20/2020 0823  ? CHOLHDL 2.8 11/20/2020 0823  ? [place] 61 11/20/2020 0823  ? ? ?Physical Exam:   ? ?VS:  BP (!) 104/56   Pulse 76   Ht '5\' 9"'$  (1.753 m)   Wt 194 lb 6.4 oz (88.2 kg)   SpO2 97%   BMI 28.71 kg/m?    ? ?Wt Readings from Last 3 Encounters:  ?08/04/21 194 lb 6.4 oz (88.2 kg)  ?05/12/21 195 lb (88.5 kg)  ?02/23/21 199 lb (90.3 kg)  ?  ? ?  GEN: Patient is in no acute distress ?HEENT: Normal ?NECK: No JVD; No carotid bruits ?LYMPHATICS: No lymphadenopathy ?CARDIAC: Hear sounds regular, 2/6 systolic murmur at the apex. ?RESPIRATORY:  Clear to auscultation without rales, wheezing or rhonchi  ?ABDOMEN: Soft, non-tender, non-distended ?MUSCULOSKELETAL:  No edema; No deformity  ?SKIN: Warm and dry ?NEUROLOGIC:  Alert and oriented x 3 ?PSYCHIATRIC:  Normal affect  ? ?Signed, ?Jenean Lindau, MD  ?08/04/2021 1:33 PM    ?[place]  ?

## 2021-08-04 NOTE — Patient Instructions (Signed)

## 2021-08-05 DIAGNOSIS — N401 Enlarged prostate with lower urinary tract symptoms: Secondary | ICD-10-CM | POA: Diagnosis not present

## 2021-08-05 DIAGNOSIS — B356 Tinea cruris: Secondary | ICD-10-CM | POA: Diagnosis not present

## 2021-08-05 DIAGNOSIS — N411 Chronic prostatitis: Secondary | ICD-10-CM | POA: Diagnosis not present

## 2021-08-05 DIAGNOSIS — R351 Nocturia: Secondary | ICD-10-CM | POA: Diagnosis not present

## 2021-08-06 DIAGNOSIS — M25462 Effusion, left knee: Secondary | ICD-10-CM | POA: Diagnosis not present

## 2021-08-06 DIAGNOSIS — M25562 Pain in left knee: Secondary | ICD-10-CM | POA: Diagnosis not present

## 2021-08-06 DIAGNOSIS — Z96652 Presence of left artificial knee joint: Secondary | ICD-10-CM | POA: Diagnosis not present

## 2021-08-06 DIAGNOSIS — M62551 Muscle wasting and atrophy, not elsewhere classified, right thigh: Secondary | ICD-10-CM | POA: Diagnosis not present

## 2021-08-06 DIAGNOSIS — R2689 Other abnormalities of gait and mobility: Secondary | ICD-10-CM | POA: Diagnosis not present

## 2021-08-10 DIAGNOSIS — M25462 Effusion, left knee: Secondary | ICD-10-CM | POA: Diagnosis not present

## 2021-08-10 DIAGNOSIS — R2689 Other abnormalities of gait and mobility: Secondary | ICD-10-CM | POA: Diagnosis not present

## 2021-08-10 DIAGNOSIS — M25562 Pain in left knee: Secondary | ICD-10-CM | POA: Diagnosis not present

## 2021-08-10 DIAGNOSIS — M62551 Muscle wasting and atrophy, not elsewhere classified, right thigh: Secondary | ICD-10-CM | POA: Diagnosis not present

## 2021-08-10 DIAGNOSIS — Z96652 Presence of left artificial knee joint: Secondary | ICD-10-CM | POA: Diagnosis not present

## 2021-08-27 DIAGNOSIS — Z1331 Encounter for screening for depression: Secondary | ICD-10-CM | POA: Diagnosis not present

## 2021-08-27 DIAGNOSIS — E785 Hyperlipidemia, unspecified: Secondary | ICD-10-CM | POA: Diagnosis not present

## 2021-08-27 DIAGNOSIS — Z0000 Encounter for general adult medical examination without abnormal findings: Secondary | ICD-10-CM | POA: Diagnosis not present

## 2021-08-27 DIAGNOSIS — Z9181 History of falling: Secondary | ICD-10-CM | POA: Diagnosis not present

## 2021-09-14 DIAGNOSIS — J309 Allergic rhinitis, unspecified: Secondary | ICD-10-CM | POA: Diagnosis not present

## 2021-09-14 DIAGNOSIS — R0981 Nasal congestion: Secondary | ICD-10-CM | POA: Diagnosis not present

## 2021-09-15 DIAGNOSIS — I10 Essential (primary) hypertension: Secondary | ICD-10-CM | POA: Diagnosis not present

## 2021-09-15 DIAGNOSIS — J449 Chronic obstructive pulmonary disease, unspecified: Secondary | ICD-10-CM | POA: Diagnosis not present

## 2021-09-15 DIAGNOSIS — E785 Hyperlipidemia, unspecified: Secondary | ICD-10-CM | POA: Diagnosis not present

## 2021-09-22 DIAGNOSIS — J340 Abscess, furuncle and carbuncle of nose: Secondary | ICD-10-CM | POA: Diagnosis not present

## 2021-10-08 DIAGNOSIS — K2100 Gastro-esophageal reflux disease with esophagitis, without bleeding: Secondary | ICD-10-CM | POA: Diagnosis not present

## 2021-10-14 DIAGNOSIS — Z471 Aftercare following joint replacement surgery: Secondary | ICD-10-CM | POA: Diagnosis not present

## 2021-10-14 DIAGNOSIS — Z96652 Presence of left artificial knee joint: Secondary | ICD-10-CM | POA: Diagnosis not present

## 2021-11-10 ENCOUNTER — Telehealth: Payer: Self-pay | Admitting: Cardiology

## 2021-11-10 NOTE — Telephone Encounter (Signed)
Pt came by office with kardia result and strip has artifact. Pt request an appointment due to 2 episodes of rapid heart rate. Appt made. Pt denies chest pain or shortness of breath.

## 2021-11-10 NOTE — Telephone Encounter (Signed)
Left message to call back  

## 2021-11-10 NOTE — Telephone Encounter (Signed)
Patient c/o Palpitations:  High priority if patient c/o lightheadedness, shortness of breath, or chest pain  How long have you had palpitations/irregular HR/ Afib? Irregular heartbeat had an episode on Friday and had one yesterday. Are you having the symptoms now? no  Are you currently experiencing lightheadedness, SOB or CP? no  Do you have a history of afib (atrial fibrillation) or irregular heart rhythm? Yes, has history of irregular heart beat.   Have you checked your BP or HR? (document readings if available): HR was 135  Are you experiencing any other symptoms? Patient states he has a weak feeling.

## 2021-11-12 ENCOUNTER — Telehealth: Payer: Self-pay | Admitting: Cardiology

## 2021-11-12 DIAGNOSIS — K219 Gastro-esophageal reflux disease without esophagitis: Secondary | ICD-10-CM | POA: Diagnosis not present

## 2021-11-12 DIAGNOSIS — R000 Tachycardia, unspecified: Secondary | ICD-10-CM | POA: Diagnosis not present

## 2021-11-12 DIAGNOSIS — R0789 Other chest pain: Secondary | ICD-10-CM | POA: Diagnosis not present

## 2021-11-12 DIAGNOSIS — R079 Chest pain, unspecified: Secondary | ICD-10-CM | POA: Diagnosis not present

## 2021-11-12 NOTE — Telephone Encounter (Signed)
Called patient and he reported that he has been having high heart rates around 115 - 125 and the last blood pressure he took was 118/70. He also reported having chest pressure "like an elephant was sitting on his sternum" and chest pain was between his shoulder blades in the middle of his back. He reported it "feeling like his heart is shaking in his chest". At the time of the call he had already taken 3 nitro and was feeling a little light headed. Patient also reported that he had the same symptoms this past Monday but did not call in to the office. Based on the symptoms he was telling me I recommended that he have someone drive him to the ER since he was feeling light headed. At the ER he could be evaluated for the symptoms he was feeling and the patient stated that he would go to the ER. Patient had no further questions at this time.

## 2021-11-12 NOTE — Telephone Encounter (Signed)
   Patient c/o Palpitations:  High priority if patient c/o lightheadedness, shortness of breath, or chest pain  How long have you had palpitations/irregular HR/ Afib? Are you having the symptoms now? No   Are you currently experiencing lightheadedness, SOB or CP? None   Do you have a history of afib (atrial fibrillation) or irregular heart rhythm? No   Have you checked your BP or HR? (document readings if available): 118/70  Since 2:15 pm his HR 115-125  Are you experiencing any other symptoms?   Pt said, he's been having elevated HR for the last hour his HR is around 115-125. He denied CP, SOB and lightheadedness but he said, he too 3 nitroglycerin so far

## 2021-11-15 ENCOUNTER — Other Ambulatory Visit: Payer: Self-pay

## 2021-11-16 ENCOUNTER — Ambulatory Visit (INDEPENDENT_AMBULATORY_CARE_PROVIDER_SITE_OTHER): Payer: PPO

## 2021-11-16 ENCOUNTER — Ambulatory Visit: Payer: PPO | Admitting: Cardiology

## 2021-11-16 ENCOUNTER — Encounter: Payer: Self-pay | Admitting: Cardiology

## 2021-11-16 VITALS — BP 128/60 | HR 64 | Ht 69.0 in | Wt 195.0 lb

## 2021-11-16 DIAGNOSIS — R002 Palpitations: Secondary | ICD-10-CM | POA: Diagnosis not present

## 2021-11-16 DIAGNOSIS — E782 Mixed hyperlipidemia: Secondary | ICD-10-CM | POA: Diagnosis not present

## 2021-11-16 DIAGNOSIS — I4729 Other ventricular tachycardia: Secondary | ICD-10-CM

## 2021-11-16 DIAGNOSIS — I10 Essential (primary) hypertension: Secondary | ICD-10-CM | POA: Diagnosis not present

## 2021-11-16 DIAGNOSIS — I2510 Atherosclerotic heart disease of native coronary artery without angina pectoris: Secondary | ICD-10-CM

## 2021-11-16 NOTE — Patient Instructions (Addendum)
Medication Instructions:  Your physician recommends that you continue on your current medications as directed. Please refer to the Current Medication list given to you today.  *If you need a refill on your cardiac medications before your next appointment, please call your pharmacy*   Lab Work: None ordered If you have labs (blood work) drawn today and your tests are completely normal, you will receive your results only by: [place] (if you have MyChart) OR A paper copy in the mail If you have any lab test that is abnormal or we need to change your treatment, we will call you to review the results.   Testing/Procedures: Your physician has requested that you have a lexiscan myoview. For further information please visit [email or web address]. Please follow instruction sheet, as given.  The test will take approximately 3 to 4 hours to complete; you may bring reading material.  If someone comes with you to your appointment, they will need to remain in the main lobby due to limited space in the testing area.   How to prepare for your Myocardial Perfusion Test: Do not eat or drink 3 hours prior to your test, except you may have water. Do not consume products containing caffeine (regular or decaffeinated) 12 hours prior to your test. (ex: coffee, chocolate, sodas, tea). Do bring a list of your current medications with you.  If not listed below, you may take your medications as normal.  Do wear comfortable clothes (no dresses or overalls) and walking shoes, tennis shoes preferred (No heels or open toe shoes are allowed). Do NOT wear cologne, perfume, aftershave, or lotions (deodorant is allowed). If these instructions are not followed, your test will have to be rescheduled.    Follow-Up: At [facility], you and your health needs are our priority.  As part of our continuing mission to provide you with exceptional heart care, we have created designated Provider Care Teams.  These Care  Teams include your primary Cardiologist (physician) and Advanced Practice Providers (APPs -  Physician Assistants and Nurse Practitioners) who all work together to provide you with the care you need, when you need it.  We recommend signing up for the patient portal called "MyChart".  Sign up information is provided on this After Visit Summary.  MyChart is used to connect with patients for Virtual Visits (Telemedicine).  Patients are able to view lab/test results, encounter notes, upcoming appointments, etc.  Non-urgent messages can be sent to your provider as well.   To learn more about what you can do with MyChart, go to [email or web address].    Your next appointment:   3 month(s)  The format for your next appointment:   In Person  Provider:   Jyl Heinz, MD   Other Instructions Cardiac Nuclear Scan A cardiac nuclear scan is a test that is done to check the flow of blood to your heart. It is done when you are resting and when you are exercising. The test looks for problems such as: Not enough blood reaching a portion of the heart. The heart muscle not working as it should. You may need this test if: You have heart disease. You have had lab results that are not normal. You have had heart surgery or a balloon procedure to open up blocked arteries (angioplasty). You have chest pain. You have shortness of breath. In this test, a special dye (tracer) is put into your bloodstream. The tracer will travel to your heart. A camera will then take pictures of your heart to  see how the tracer moves through your heart. This test is usually done at a hospital and takes 2-4 hours. Tell a doctor about: Any allergies you have. All medicines you are taking, including vitamins, herbs, eye drops, creams, and over-the-counter medicines. Any problems you or family members have had with anesthetic medicines. Any blood disorders you have. Any surgeries you have had. Any medical conditions you  have. Whether you are pregnant or may be pregnant. What are the risks? Generally, this is a safe test. However, problems may occur, such as: Serious chest pain and heart attack. This is only a risk if the stress portion of the test is done. Rapid heartbeat. A feeling of warmth in your chest. This feeling usually does not last long. Allergic reaction to the tracer. What happens before the test? Ask your doctor about changing or stopping your normal medicines. This is important. Follow instructions from your doctor about what you cannot eat or drink. Remove your jewelry on the day of the test. What happens during the test? An IV tube will be inserted into one of your veins. Your doctor will give you a small amount of tracer through the IV tube. You will wait for 20-40 minutes while the tracer moves through your bloodstream. Your heart will be monitored with an electrocardiogram (ECG). You will lie down on an exam table. Pictures of your heart will be taken for about 15-20 minutes. You may also have a stress test. For this test, one of these things may be done: You will be asked to exercise on a treadmill or a stationary bike. You will be given medicines that will make your heart work harder. This is done if you are unable to exercise. When blood flow to your heart has peaked, a tracer will again be given through the IV tube. After 20-40 minutes, you will get back on the exam table. More pictures will be taken of your heart. Depending on the tracer that is used, more pictures may need to be taken 3-4 hours later. Your IV tube will be removed when the test is over. The test may vary among doctors and hospitals. What happens after the test? Ask your doctor: Whether you can return to your normal schedule, including diet, activities, and medicines. Whether you should drink more fluids. This will help to remove the tracer from your body. Drink enough fluid to keep your pee (urine) pale  yellow. Ask your doctor, or the department that is doing the test: When will my results be ready? How will I get my results? Summary A cardiac nuclear scan is a test that is done to check the flow of blood to your heart. Tell your doctor whether you are pregnant or may be pregnant. Before the test, ask your doctor about changing or stopping your normal medicines. This is important. Ask your doctor whether you can return to your normal activities. You may be asked to drink more fluids. This information is not intended to replace advice given to you by your health care provider. Make sure you discuss any questions you have with your health care provider. Document Revised: 07/25/2018 Document Reviewed: 09/18/2017 Elsevier Patient Education  [place].

## 2021-11-16 NOTE — Progress Notes (Signed)
Cardiology Office Note:    Date:  11/16/2021   ID:  Duane Hamilton, DOB 06-07-1945, MRN [id number]  PCP:  Nicoletta Dress, MD  Cardiologist:  Jenean Lindau, MD   Referring MD: Nicoletta Dress, MD    ASSESSMENT:    1. Coronary artery disease involving native coronary artery of native heart without angina pectoris   2. Essential hypertension   3. 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 Chronic coronary artery disease   5. Mixed hyperlipidemia   6. Palpitations    PLAN:    In order of problems listed above:  Coronary artery disease: Secondary prevention stressed to the patient.  Importance of compliance with diet medication stressed and she vocalized understanding.  In view of chest discomfort we will do an exercise stress Cardiolite. Palpitations: These appear concerning and have happened a couple of times.  We will put a monitor for 1 month test for this. Essential hypertension: Blood pressure stable and diet was emphasized. Mixed dyslipidemia: On lipid-lowering medications and I reviewed medications and lipids with him at length.  Questions were answered to satisfaction. Patient will be seen in follow-up appointment in 6 months or earlier if the patient has any concerns    Medication Adjustments/Labs and Tests Ordered: Current medicines are reviewed at length with the patient today.  Concerns regarding medicines are outlined above.  No orders of the defined types were placed in this encounter.  No orders of the defined types were placed in this encounter.    No chief complaint on file.    History of Present Illness:    Duane Hamilton is a 76 y.o. male.  Patient has past medical history of coronary artery disease, dyslipidemia and essential hypertension.  He mentions to me that he has noticed palpitations about twice in the past week.  No orthopnea or PND.  She does feel some chest discomfort when this happens.  He is concerned about it.  He went to the  emergency room and I reviewed those records.  At the time of my evaluation, the patient is alert awake oriented and in no distress.  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    cetirizine (ZYRTEC) 10 MG tablet Take 10 mg by mouth at bedtime.   famotidine (PEPCID) 40 MG tablet Take 40 mg by mouth daily.   finasteride (PROSCAR) 5 MG tablet Take 5 mg by mouth daily.   levothyroxine (SYNTHROID, LEVOTHROID) 75 MCG tablet Take 75 mcg by mouth daily before breakfast.    metoprolol succinate (TOPROL-XL)  50 MG 24 hr tablet Take 50 mg by mouth daily.    nitroGLYCERIN (NITROSTAT) 0.4 MG SL tablet Place 0.4 mg under the tongue every 5 (five) minutes as needed for chest pain.   pantoprazole (PROTONIX) 40 MG tablet Take 40 mg by mouth daily.   simvastatin (ZOCOR) 40 MG tablet Take 40 mg by mouth daily at 6 PM.    tamsulosin (FLOMAX) 0.4 MG CAPS capsule Take 0.4 mg by mouth daily.      Allergies:   Lisinopril, Sulfa antibiotics, and Tizanidine   Social History   Socioeconomic History    Marital status: Married    Spouse name: Not on file   Number of children: Not on file   Years of education: Not on file   Highest education level: Not on file  Occupational History   Not on file  Tobacco Use   Smoking status: Never   Smokeless tobacco: Never  Vaping Use   Vaping Use: Never used  Substance and Sexual Activity   Alcohol use: No   Drug use: No   Sexual activity: Not on file  Other Topics Concern   Not on file  Social History Narrative   Not on file   Social Determinants of Health   Financial Resource Strain: Not on file  Food Insecurity: Not on file  Transportation Needs: Not on file  Physical Activity: Not on file  Stress: Not on file  Social Connections: Not on file     Family History: The patient's family history includes Atrial fibrillation in his father; Cancer in his mother; Heart disease in his sister; Hypertension in his mother.  ROS:   Please see the history of present illness.    All other systems reviewed and are negative.  EKGs/Labs/Other Studies Reviewed:    The following studies were reviewed today: I reviewed records from emergency room visits.   Recent Labs: 11/20/2020: ALT 14; BUN 12; Creatinine, Ser 1.30; Hemoglobin 14.6; Platelets 147; Potassium 4.5; Sodium 138; TSH 1.770  Recent Lipid Panel    Component Value Date/Time   CHOL 119 11/20/2020 0823   TRIG 82 11/20/2020 0823   HDL 42 11/20/2020 0823   CHOLHDL 2.8 11/20/2020 0823   LDLCALC 61 11/20/2020 0823    Physical Exam:    VS:  BP 128/60   Pulse 64   Ht '5\' 9"'$  (1.753 m)   Wt 195 lb (88.5 kg)   SpO2 98%   BMI 28.80 kg/m     Wt Readings from Last 3 Encounters:  11/16/21 195 lb (88.5 kg)  08/04/21 194 lb 6.4 oz (88.2 kg)  05/12/21 195 lb (88.5 kg)     GEN: Patient is in no acute distress HEENT: Normal NECK: No JVD; No carotid bruits LYMPHATICS: No lymphadenopathy CARDIAC: Hear sounds regular, 2/6 systolic murmur at the apex. RESPIRATORY:  Clear to auscultation  without rales, wheezing or rhonchi  ABDOMEN: Soft, non-tender, non-distended MUSCULOSKELETAL:  No edema; No deformity  SKIN: Warm and dry NEUROLOGIC:  Alert and oriented x 3 PSYCHIATRIC:  Normal affect   Signed, Jenean Lindau, MD  11/16/2021 2:34 PM    [place]

## 2021-11-17 ENCOUNTER — Telehealth: Payer: Self-pay

## 2021-11-17 NOTE — Telephone Encounter (Signed)
Detailed instructions left on the patient's answering machine. Asked to call back with any questions. S.Josiane Labine EMTP 

## 2021-11-18 DIAGNOSIS — H353221 Exudative age-related macular degeneration, left eye, with active choroidal neovascularization: Secondary | ICD-10-CM | POA: Diagnosis not present

## 2021-11-18 DIAGNOSIS — H25813 Combined forms of age-related cataract, bilateral: Secondary | ICD-10-CM | POA: Diagnosis not present

## 2021-11-23 DIAGNOSIS — L570 Actinic keratosis: Secondary | ICD-10-CM | POA: Diagnosis not present

## 2021-11-23 DIAGNOSIS — Z85820 Personal history of malignant melanoma of skin: Secondary | ICD-10-CM | POA: Diagnosis not present

## 2021-11-23 DIAGNOSIS — L578 Other skin changes due to chronic exposure to nonionizing radiation: Secondary | ICD-10-CM | POA: Diagnosis not present

## 2021-11-23 DIAGNOSIS — L814 Other melanin hyperpigmentation: Secondary | ICD-10-CM | POA: Diagnosis not present

## 2021-11-25 ENCOUNTER — Ambulatory Visit (INDEPENDENT_AMBULATORY_CARE_PROVIDER_SITE_OTHER): Payer: PPO

## 2021-11-25 DIAGNOSIS — I2510 Atherosclerotic heart disease of native coronary artery without angina pectoris: Secondary | ICD-10-CM | POA: Diagnosis not present

## 2021-11-25 DIAGNOSIS — I4729 Other ventricular tachycardia: Secondary | ICD-10-CM

## 2021-11-25 LAB — MYOCARDIAL PERFUSION IMAGING
Angina Index: 0
Duke Treadmill Score: 5
Estimated workload: 5.2
Exercise duration (min): 4 min
Exercise duration (sec): 30 s
LV dias vol: 96 mL (ref 62–150)
LV sys vol: 32 mL
MPHR: 144 {beats}/min
Nuc Stress EF: 67 %
Peak HR: 125 {beats}/min
Percent HR: 86 %
Rest HR: 68 {beats}/min
Rest Nuclear Isotope Dose: 10.5 mCi
SDS: 1
SRS: 4
SSS: 5
ST Depression (mm): 0 mm
Stress Nuclear Isotope Dose: 30.1 mCi
TID: 1.03

## 2021-11-25 MED ORDER — TECHNETIUM TC 99M TETROFOSMIN IV KIT
10.5000 | PACK | Freq: Once | INTRAVENOUS | Status: AC | PRN
  Administered 2021-11-25: 10.5 via INTRAVENOUS

## 2021-11-25 MED ORDER — TECHNETIUM TC 99M TETROFOSMIN IV KIT
30.1000 | PACK | Freq: Once | INTRAVENOUS | Status: AC | PRN
  Administered 2021-11-25: 30.1 via INTRAVENOUS

## 2021-11-29 ENCOUNTER — Telehealth: Payer: Self-pay

## 2021-11-29 NOTE — Telephone Encounter (Signed)
-----   Message from Jenean Lindau, MD sent at 11/25/2021  4:28 PM EDT ----- The results of the study is unremarkable. Please inform patient. I will discuss in detail at next appointment. Cc  primary care/referring physician Jenean Lindau, MD 11/25/2021 4:28 PM

## 2021-11-30 NOTE — Telephone Encounter (Signed)
Patient returned RN's call regarding results. 

## 2021-12-01 NOTE — Telephone Encounter (Signed)
Results reviewed with Lelon Frohlich per DPR as per Dr. Julien Nordmann note.  Ann verbalized understanding and had no additional questions. Routed to PCP.

## 2021-12-13 DIAGNOSIS — N400 Enlarged prostate without lower urinary tract symptoms: Secondary | ICD-10-CM | POA: Diagnosis not present

## 2021-12-13 DIAGNOSIS — I252 Old myocardial infarction: Secondary | ICD-10-CM | POA: Diagnosis not present

## 2021-12-13 DIAGNOSIS — I10 Essential (primary) hypertension: Secondary | ICD-10-CM | POA: Diagnosis not present

## 2021-12-13 DIAGNOSIS — E785 Hyperlipidemia, unspecified: Secondary | ICD-10-CM | POA: Diagnosis not present

## 2021-12-13 DIAGNOSIS — H3530 Unspecified macular degeneration: Secondary | ICD-10-CM | POA: Diagnosis not present

## 2021-12-13 DIAGNOSIS — E039 Hypothyroidism, unspecified: Secondary | ICD-10-CM | POA: Diagnosis not present

## 2021-12-13 DIAGNOSIS — I25119 Atherosclerotic heart disease of native coronary artery with unspecified angina pectoris: Secondary | ICD-10-CM | POA: Diagnosis not present

## 2021-12-13 DIAGNOSIS — B372 Candidiasis of skin and nail: Secondary | ICD-10-CM | POA: Diagnosis not present

## 2021-12-13 DIAGNOSIS — J309 Allergic rhinitis, unspecified: Secondary | ICD-10-CM | POA: Diagnosis not present

## 2021-12-13 DIAGNOSIS — Z7982 Long term (current) use of aspirin: Secondary | ICD-10-CM | POA: Diagnosis not present

## 2021-12-13 DIAGNOSIS — E663 Overweight: Secondary | ICD-10-CM | POA: Diagnosis not present

## 2021-12-13 DIAGNOSIS — K219 Gastro-esophageal reflux disease without esophagitis: Secondary | ICD-10-CM | POA: Diagnosis not present

## 2021-12-22 DIAGNOSIS — H5213 Myopia, bilateral: Secondary | ICD-10-CM | POA: Diagnosis not present

## 2022-01-20 DIAGNOSIS — K219 Gastro-esophageal reflux disease without esophagitis: Secondary | ICD-10-CM | POA: Diagnosis not present

## 2022-01-20 DIAGNOSIS — R7301 Impaired fasting glucose: Secondary | ICD-10-CM | POA: Diagnosis not present

## 2022-01-20 DIAGNOSIS — I10 Essential (primary) hypertension: Secondary | ICD-10-CM | POA: Diagnosis not present

## 2022-01-20 DIAGNOSIS — E785 Hyperlipidemia, unspecified: Secondary | ICD-10-CM | POA: Diagnosis not present

## 2022-01-20 DIAGNOSIS — M25551 Pain in right hip: Secondary | ICD-10-CM | POA: Diagnosis not present

## 2022-01-20 DIAGNOSIS — N410 Acute prostatitis: Secondary | ICD-10-CM | POA: Diagnosis not present

## 2022-01-20 DIAGNOSIS — K222 Esophageal obstruction: Secondary | ICD-10-CM | POA: Diagnosis not present

## 2022-01-20 DIAGNOSIS — E039 Hypothyroidism, unspecified: Secondary | ICD-10-CM | POA: Diagnosis not present

## 2022-01-20 DIAGNOSIS — Z23 Encounter for immunization: Secondary | ICD-10-CM | POA: Diagnosis not present

## 2022-02-24 DIAGNOSIS — N411 Chronic prostatitis: Secondary | ICD-10-CM | POA: Diagnosis not present

## 2022-02-24 DIAGNOSIS — N401 Enlarged prostate with lower urinary tract symptoms: Secondary | ICD-10-CM | POA: Diagnosis not present

## 2022-02-25 ENCOUNTER — Ambulatory Visit: Payer: PPO | Admitting: Cardiology

## 2022-03-17 DIAGNOSIS — J019 Acute sinusitis, unspecified: Secondary | ICD-10-CM | POA: Diagnosis not present

## 2022-04-01 DIAGNOSIS — R062 Wheezing: Secondary | ICD-10-CM | POA: Diagnosis not present

## 2022-04-01 DIAGNOSIS — J069 Acute upper respiratory infection, unspecified: Secondary | ICD-10-CM | POA: Diagnosis not present

## 2022-04-01 DIAGNOSIS — J309 Allergic rhinitis, unspecified: Secondary | ICD-10-CM | POA: Diagnosis not present

## 2022-04-01 DIAGNOSIS — R051 Acute cough: Secondary | ICD-10-CM | POA: Diagnosis not present

## 2022-04-07 ENCOUNTER — Ambulatory Visit: Payer: PPO | Admitting: Cardiology

## 2022-04-07 DIAGNOSIS — J208 Acute bronchitis due to other specified organisms: Secondary | ICD-10-CM | POA: Diagnosis not present

## 2022-04-21 ENCOUNTER — Ambulatory Visit: Payer: PPO | Admitting: Cardiology

## 2022-04-27 ENCOUNTER — Ambulatory Visit: Payer: PPO | Attending: Cardiology | Admitting: Cardiology

## 2022-04-27 ENCOUNTER — Encounter: Payer: Self-pay | Admitting: Cardiology

## 2022-04-27 VITALS — BP 138/60 | HR 68 | Ht 69.0 in | Wt 191.0 lb

## 2022-04-27 DIAGNOSIS — I4729 Other ventricular tachycardia: Secondary | ICD-10-CM

## 2022-04-27 DIAGNOSIS — E782 Mixed hyperlipidemia: Secondary | ICD-10-CM | POA: Diagnosis not present

## 2022-04-27 DIAGNOSIS — I2510 Atherosclerotic heart disease of native coronary artery without angina pectoris: Secondary | ICD-10-CM | POA: Diagnosis not present

## 2022-04-27 DIAGNOSIS — N289 Disorder of kidney and ureter, unspecified: Secondary | ICD-10-CM | POA: Diagnosis not present

## 2022-04-27 DIAGNOSIS — I10 Essential (primary) hypertension: Secondary | ICD-10-CM | POA: Diagnosis not present

## 2022-04-27 MED ORDER — NITROGLYCERIN 0.4 MG SL SUBL: 0.4000 mg | SUBLINGUAL_TABLET | SUBLINGUAL | 11 refills | Status: AC | PRN

## 2022-04-27 NOTE — Progress Notes (Signed)
Cardiology Office Note:    Date:  04/27/2022   ID:  Duane Hamilton, DOB 1945-09-23, MRN [id number]  PCP:  Nicoletta Dress, MD  Cardiologist:  Jenean Lindau, MD   Referring MD: Nicoletta Dress, MD    ASSESSMENT:    1. Coronary artery disease involving native coronary artery of native heart without angina pectoris   2. Essential hypertension   3. 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 Renal insufficiency, mild   5. Mixed hyperlipidemia    PLAN:    In order of problems listed above:  Coronary artery disease: Secondary prevention stressed with the patient.  Importance of compliance with diet medication stressed and he vocalized understanding.  He was advised to walk at least half an hour a day 5 days a week and he promises to do so. Essential hypertension: Blood pressure stable and diet was emphasized.  Lifestyle modification urged. Mixed dyslipidemia: On lipid-lowering medications followed by primary care.  Lipids were reviewed diet emphasized.  Weight reduction stressed risks of obesity explained. Nonsustained ventricular tachycardia: Stable.  No recurrences.  Medical management. Patient will be seen in follow-up appointment in 9 months or earlier if the patient has any concerns    Medication Adjustments/Labs and Tests Ordered: Current medicines are reviewed at length with the patient today.  Concerns regarding medicines are outlined above.  No orders of the defined types were placed in this encounter.  No orders of the defined types were placed in this encounter.    No chief complaint on file.    History of Present Illness:    Duane Hamilton is a 77 y.o. male.  Patient has past medical history of coronary artery disease, essential hypertension, mixed dyslipidemia, nonsustained ventricular tachycardia.  He denies any problems at this time and takes care of activities of daily living.  No chest pain orthopnea or PND.  Overall he does lead a sedentary  lifestyle.  At the time of my evaluation, the patient is alert awake oriented and in no distress.  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    cetirizine (ZYRTEC) 10 MG tablet Take 10 mg by mouth at bedtime.   famotidine (PEPCID) 40 MG tablet Take 40 mg by mouth daily.   finasteride (PROSCAR) 5 MG tablet Take 5 mg by mouth daily.   levothyroxine (SYNTHROID, LEVOTHROID) 75 MCG tablet Take 75 mcg by mouth daily before breakfast.    meloxicam (MOBIC) 15 MG tablet Take 15 mg by mouth daily.   metoprolol succinate (TOPROL-XL) 50 MG 24 hr  tablet Take 50 mg by mouth daily.    nitroGLYCERIN (NITROSTAT) 0.4 MG SL tablet Place 0.4 mg under the tongue every 5 (five) minutes as needed for chest pain.   pantoprazole (PROTONIX) 40 MG tablet Take 40 mg by mouth daily.   simvastatin (ZOCOR) 40 MG tablet Take 40 mg by mouth daily at 6 PM.    tamsulosin (FLOMAX) 0.4 MG CAPS capsule Take 0.4 mg by mouth daily.      Allergies:   Lisinopril, Sulfa antibiotics, and Tizanidine   Social History    Socioeconomic History   Marital status: Married    Spouse name: Not on file   Number of children: Not on file   Years of education: Not on file   Highest education level: Not on file  Occupational History   Not on file  Tobacco Use   Smoking status: Never   Smokeless tobacco: Never  Vaping Use   Vaping Use: Never used  Substance and Sexual Activity   Alcohol use: No   Drug use: No   Sexual activity: Not on file  Other Topics Concern   Not on file  Social History Narrative   Not on file   Social Determinants of Health   Financial Resource Strain: Not on file  Food Insecurity: Not on file  Transportation Needs: Not on file  Physical Activity: Not on file  Stress: Not on file  Social Connections: Not on file     Family History: The patient's family history includes Atrial fibrillation in his father; Cancer in his mother; Heart disease in his sister; Hypertension in his mother.  ROS:   Please see the history of present illness.    All other systems reviewed and are negative.  EKGs/Labs/Other Studies Reviewed:    The following studies were reviewed today: I discussed my findings with the patient at length.   Recent Labs: No results found for requested labs within last 365 days.  Recent Lipid Panel    Component Value Date/Time   CHOL 119 11/20/2020 0823   TRIG 82 11/20/2020 0823   HDL 42 11/20/2020 0823   CHOLHDL 2.8 11/20/2020 0823   LDLCALC 61 11/20/2020 0823    Physical Exam:    VS:  BP 138/60   Pulse 68   Ht '5\' 9"'$  (1.753 m)   Wt 191 lb (86.6 kg)   SpO2 98%   BMI 28.21 kg/m     Wt Readings from Last 3 Encounters:  04/27/22 191 lb (86.6 kg)  11/25/21 195 lb (88.5 kg)  11/16/21 195 lb (88.5 kg)     GEN: Patient is in no acute distress HEENT: Normal NECK: No JVD; No carotid bruits LYMPHATICS: No lymphadenopathy CARDIAC: Hear sounds regular, 2/6 systolic murmur at the apex. RESPIRATORY:  Clear to auscultation without rales, wheezing or  rhonchi  ABDOMEN: Soft, non-tender, non-distended MUSCULOSKELETAL:  No edema; No deformity  SKIN: Warm and dry NEUROLOGIC:  Alert and oriented x 3 PSYCHIATRIC:  Normal affect   Signed, Jenean Lindau, MD  04/27/2022 3:40 PM     [facility]

## 2022-04-27 NOTE — Addendum Note (Signed)
Addended by: Truddie Hidden on: 04/27/2022 03:55 PM   Modules accepted: Orders

## 2022-04-27 NOTE — Addendum Note (Signed)
Addended by: Demetrius Barrell, Jonelle Sidle L on: 04/27/2022 03:53 PM   Modules accepted: Orders

## 2022-04-27 NOTE — Patient Instructions (Signed)
Medication Instructions:  Your physician recommends that you continue on your current medications as directed. Please refer to the Current Medication list given to you today.  *If you need a refill on your cardiac medications before your next appointment, please call your pharmacy*   Lab Work: None ordered If you have labs (blood work) drawn today and your tests are completely normal, you will receive your results only by: MyChart Message (if you have MyChart) OR A paper copy in the mail If you have any lab test that is abnormal or we need to change your treatment, we will call you to review the results.   Testing/Procedures: None ordered   Follow-Up: At [facility], you and your health needs are our priority.  As part of our continuing mission to provide you with exceptional heart care, we have created designated Provider Care Teams.  These Care Teams include your primary Cardiologist (physician) and Advanced Practice Providers (APPs -  Physician Assistants and Nurse Practitioners) who all work together to provide you with the care you need, when you need it.  We recommend signing up for the patient portal called "MyChart".  Sign up information is provided on this After Visit Summary.  MyChart is used to connect with patients for Virtual Visits (Telemedicine).  Patients are able to view lab/test results, encounter notes, upcoming appointments, etc.  Non-urgent messages can be sent to your provider as well.   To learn more about what you can do with MyChart, go to [email or web address].    Your next appointment:   9 month(s)  The format for your next appointment:   In Person  Provider:   Rajan Revankar, MD    Other Instructions none  Important Information About Sugar       

## 2022-05-17 DIAGNOSIS — K6289 Other specified diseases of anus and rectum: Secondary | ICD-10-CM | POA: Diagnosis not present

## 2022-05-17 DIAGNOSIS — M25551 Pain in right hip: Secondary | ICD-10-CM | POA: Diagnosis not present

## 2022-05-17 DIAGNOSIS — M5416 Radiculopathy, lumbar region: Secondary | ICD-10-CM | POA: Diagnosis not present

## 2022-05-20 ENCOUNTER — Ambulatory Visit: Payer: PPO | Admitting: Cardiology

## 2022-05-24 DIAGNOSIS — M545 Low back pain, unspecified: Secondary | ICD-10-CM | POA: Diagnosis not present

## 2022-06-01 DIAGNOSIS — Z85820 Personal history of malignant melanoma of skin: Secondary | ICD-10-CM | POA: Diagnosis not present

## 2022-06-01 DIAGNOSIS — L570 Actinic keratosis: Secondary | ICD-10-CM | POA: Diagnosis not present

## 2022-06-21 DIAGNOSIS — R0981 Nasal congestion: Secondary | ICD-10-CM | POA: Diagnosis not present

## 2022-06-21 DIAGNOSIS — J0100 Acute maxillary sinusitis, unspecified: Secondary | ICD-10-CM | POA: Diagnosis not present

## 2022-07-14 DIAGNOSIS — Z96651 Presence of right artificial knee joint: Secondary | ICD-10-CM | POA: Diagnosis not present

## 2022-07-14 DIAGNOSIS — Z96653 Presence of artificial knee joint, bilateral: Secondary | ICD-10-CM | POA: Diagnosis not present

## 2022-07-14 DIAGNOSIS — Z96652 Presence of left artificial knee joint: Secondary | ICD-10-CM | POA: Diagnosis not present

## 2022-07-14 DIAGNOSIS — M25562 Pain in left knee: Secondary | ICD-10-CM | POA: Diagnosis not present

## 2022-07-14 DIAGNOSIS — Z471 Aftercare following joint replacement surgery: Secondary | ICD-10-CM | POA: Diagnosis not present

## 2022-07-14 DIAGNOSIS — M1711 Unilateral primary osteoarthritis, right knee: Secondary | ICD-10-CM | POA: Diagnosis not present

## 2022-07-22 DIAGNOSIS — I2510 Atherosclerotic heart disease of native coronary artery without angina pectoris: Secondary | ICD-10-CM | POA: Diagnosis not present

## 2022-07-22 DIAGNOSIS — Z6830 Body mass index (BMI) 30.0-30.9, adult: Secondary | ICD-10-CM | POA: Diagnosis not present

## 2022-07-22 DIAGNOSIS — N401 Enlarged prostate with lower urinary tract symptoms: Secondary | ICD-10-CM | POA: Diagnosis not present

## 2022-07-22 DIAGNOSIS — K222 Esophageal obstruction: Secondary | ICD-10-CM | POA: Diagnosis not present

## 2022-07-22 DIAGNOSIS — R7301 Impaired fasting glucose: Secondary | ICD-10-CM | POA: Diagnosis not present

## 2022-07-22 DIAGNOSIS — E039 Hypothyroidism, unspecified: Secondary | ICD-10-CM | POA: Diagnosis not present

## 2022-07-22 DIAGNOSIS — N411 Chronic prostatitis: Secondary | ICD-10-CM | POA: Diagnosis not present

## 2022-07-22 DIAGNOSIS — I10 Essential (primary) hypertension: Secondary | ICD-10-CM | POA: Diagnosis not present

## 2022-07-22 DIAGNOSIS — M25561 Pain in right knee: Secondary | ICD-10-CM | POA: Diagnosis not present

## 2022-07-22 DIAGNOSIS — G8929 Other chronic pain: Secondary | ICD-10-CM | POA: Diagnosis not present

## 2022-07-22 DIAGNOSIS — K219 Gastro-esophageal reflux disease without esophagitis: Secondary | ICD-10-CM | POA: Diagnosis not present

## 2022-07-22 DIAGNOSIS — E785 Hyperlipidemia, unspecified: Secondary | ICD-10-CM | POA: Diagnosis not present

## 2022-07-22 DIAGNOSIS — M25551 Pain in right hip: Secondary | ICD-10-CM | POA: Diagnosis not present

## 2022-07-22 DIAGNOSIS — Z125 Encounter for screening for malignant neoplasm of prostate: Secondary | ICD-10-CM | POA: Diagnosis not present

## 2022-08-01 DIAGNOSIS — H5213 Myopia, bilateral: Secondary | ICD-10-CM | POA: Diagnosis not present

## 2022-08-01 DIAGNOSIS — H353121 Nonexudative age-related macular degeneration, left eye, early dry stage: Secondary | ICD-10-CM | POA: Diagnosis not present

## 2022-08-01 DIAGNOSIS — H25813 Combined forms of age-related cataract, bilateral: Secondary | ICD-10-CM | POA: Diagnosis not present

## 2022-08-19 DIAGNOSIS — M25551 Pain in right hip: Secondary | ICD-10-CM | POA: Diagnosis not present

## 2022-08-19 DIAGNOSIS — M25561 Pain in right knee: Secondary | ICD-10-CM | POA: Diagnosis not present

## 2022-08-19 DIAGNOSIS — J301 Allergic rhinitis due to pollen: Secondary | ICD-10-CM | POA: Diagnosis not present

## 2022-08-19 DIAGNOSIS — G8929 Other chronic pain: Secondary | ICD-10-CM | POA: Diagnosis not present

## 2022-08-29 DIAGNOSIS — M5416 Radiculopathy, lumbar region: Secondary | ICD-10-CM | POA: Diagnosis not present

## 2022-08-31 DIAGNOSIS — N401 Enlarged prostate with lower urinary tract symptoms: Secondary | ICD-10-CM | POA: Diagnosis not present

## 2022-08-31 DIAGNOSIS — N411 Chronic prostatitis: Secondary | ICD-10-CM | POA: Diagnosis not present

## 2022-09-01 DIAGNOSIS — M5416 Radiculopathy, lumbar region: Secondary | ICD-10-CM | POA: Diagnosis not present

## 2022-09-01 DIAGNOSIS — M5136 Other intervertebral disc degeneration, lumbar region: Secondary | ICD-10-CM | POA: Diagnosis not present

## 2022-09-01 DIAGNOSIS — M47816 Spondylosis without myelopathy or radiculopathy, lumbar region: Secondary | ICD-10-CM | POA: Diagnosis not present

## 2022-09-13 NOTE — Progress Notes (Unsigned)
Cardiology Office Note:    Date:  09/14/2022   ID:  Duane Hamilton, DOB [birth year], MRN [id number]  PCP:  Paulina Fusi, MD   [facility] Providers Cardiologist:  None     Referring MD: Paulina Fusi, MD   CC: rapid HR  History of Present Illness:    Duane Hamilton is a 77 y.o. male with a hx of CAD, hypertension, NSVT, aortic stenosis, dyslipidemia, palpitations, ascending aorta dilatation.  Left heart cath in October 2020 revealed moderate nonobstructive mid LAD disease, LAD heavily calcified.  Recommendations for aggressive medical management.  Echo on 07/17/2020 revealed an EF of 60 to 65%, grade 1 DD, mild AR, mild to moderate aortic valve sclerosis/calcification without evidence of stenosis, mild dilatation of the ascending aorta 37 mm.  He had a Myoview on 11/25/2021 which were consistent with no ischemia, low risk study.  He wore an event monitor in August 2023 which revealed a baseline rhythm of sinus, no significant atrial or ventricular arrhythmias.  Most recently was evaluated by Dr. Tomie China on 04/27/2022, he was doing well from a cardiac perspective at that time and advised to follow back up in 9 months.  He presents today for follow-up of an elevated heart rate that occurred a few times over the past weekend.  He states he woke up on Friday feeling wiped out, fatigued, decided to check his pulse ox on his wife's pulse oximeter and noted that his heart rate was jumping around.  He states this occurred several times over Friday and Saturday along with profound fatigue.  Since that time he has not noticed it any longer and feels back to his baseline.  He denies any recent viral illnesses, but does endorse a fair level of stress regarding caring for his wife. He denies chest pain, dyspnea, pnd, orthopnea, n, v, dizziness, syncope, edema, weight gain, or early satiety.   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    cetirizine (ZYRTEC) 10 MG tablet Take 10 mg by mouth at bedtime.   finasteride (PROSCAR) 5 MG tablet Take 5 mg by mouth daily.   levothyroxine (SYNTHROID, LEVOTHROID) 75 MCG tablet Take 75 mcg by mouth daily before breakfast.    metoprolol succinate (TOPROL-XL) 50 MG 24 hr tablet Take 50 mg by mouth daily.    nitroGLYCERIN (NITROSTAT) 0.4 MG SL tablet Place 1 tablet (0.4 mg total) under the tongue every 5 (five) minutes as needed for chest pain.   pantoprazole (PROTONIX) 40 MG tablet Take 40 mg by mouth daily.  simvastatin (ZOCOR) 40 MG tablet Take 40 mg by mouth daily at 6 PM.    tamsulosin (FLOMAX) 0.4 MG CAPS capsule Take 0.4 mg by mouth daily.      Allergies:   Lisinopril, Sulfa antibiotics, and Tizanidine   Social History   Socioeconomic History   Marital status: Married    Spouse name: Not on file   Number of children: Not on file   Years of education: Not on file   Highest education level: Not on file  Occupational History   Not on file  Tobacco Use   Smoking status: Never   Smokeless tobacco: Never   Vaping Use   Vaping Use: Never used  Substance and Sexual Activity   Alcohol use: No   Drug use: No   Sexual activity: Not on file  Other Topics Concern   Not on file  Social History Narrative   Not on file   Social Determinants of Health   Financial Resource Strain: Not on file  Food Insecurity: Not on file  Transportation Needs: Not on file  Physical Activity: Not on file  Stress: Not on file  Social Connections: Not on file     Family History: The patient's family history includes Atrial fibrillation in his father; Cancer in his mother; Heart disease in his sister; Hypertension in his mother.  ROS:   Please see the history of present illness.     All other systems reviewed and are negative.  EKGs/Labs/Other Studies Reviewed:    The following studies were reviewed today: Cardiac Studies & Procedures   CARDIAC CATHETERIZATION  CARDIAC CATHETERIZATION 01/21/2019  Narrative  Moderate nonobstructive 65% mid LAD disease and 75% first diagonal (small) that arises from the diseased region.  LAD is heavily calcified.  Normal left main.  Circumflex is calcified and as noted above there is 60 to 70% ostial narrowing of the small first marginal.  Circumflex is otherwise widely patent.  RCA contains mid vessel ectasia.  Moderate calcification noted throughout the course of the RCA.  No obstructive disease is noted.  Normal LV function and hemodynamics  RECOMMENDATIONS:   Preventive therapy with aggressive statin lowering of LDL to less than 70, blood pressure control to less than 130/80 mmHg, moderate aerobic activity (greater than 150 minutes/week), evaluation of glycemic control, and consideration of VASCEPA if TG is > 135.  Findings Coronary Findings Diagnostic  Dominance: Right  Left Anterior Descending Prox LAD lesion is 50% stenosed. Prox LAD to Mid LAD lesion is 65% stenosed.  First Diagonal Branch 1st Diag lesion is 75% stenosed.  Left Circumflex Mid Cx  lesion is 30% stenosed.  First Obtuse Marginal Branch Vessel is small in size. 1st Mrg lesion is 75% stenosed.  Third Obtuse Marginal Branch Vessel is small in size.  Right Coronary Artery Prox RCA to Mid RCA lesion is 5% stenosed.  Intervention  No interventions have been documented.   STRESS TESTS  MYOCARDIAL PERFUSION IMAGING 11/25/2021  Narrative   Findings are consistent with no ischemia and no prior myocardial infarction. The study is low risk.   No ST deviation was noted.   Left ventricular function is normal. Nuclear stress EF: 67 %. The left ventricular ejection fraction is hyperdynamic (>65%). End diastolic cavity size is normal.   Prior study available for comparison from 11/11/2020.   ECHOCARDIOGRAM  ECHOCARDIOGRAM COMPLETE 07/17/2020  Narrative ECHOCARDIOGRAM REPORT    Patient Name:   EATON DICRISTINA Date of Exam: 07/17/2020 Medical Rec #:  [id number]  Height:       69.0 in Accession #:    [id number]      Weight:       196.6 lb Date of Birth:  09-07-1945       BSA:          2.051 m Patient Age:    75 years        BP:           130/66 mmHg Patient Gender: M               HR:           63 bpm. Exam Location:  [facility]  Procedure: 2D Echo  Indications:    Coronary artery disease involving native coronary artery of native heart without angina pectoris [I25.10 (ICD-10-CM)]; Essential hypertension [I10 (ICD-10-CM)]  History:        Patient has no prior history of Echocardiogram examinations. Arrythmias:Nonsustained ventricular tachycardia; Risk Factors:Dyslipidemia.  Sonographer:    Louie Boston Referring Phys: Rito Ehrlich [facility]  IMPRESSIONS   1. Left ventricular ejection fraction, by estimation, is 60 to 65%. The left ventricle has normal function. The left ventricle has no regional wall motion abnormalities. Left ventricular diastolic parameters are consistent with Grade I diastolic dysfunction (impaired relaxation). 2. The mitral valve is normal  in structure. No evidence of mitral valve regurgitation. No evidence of mitral stenosis. 3. The aortic valve is normal in structure. Aortic valve regurgitation is mild. Mild to moderate aortic valve sclerosis/calcification is present, without any evidence of aortic stenosis. 4. There is mild dilatation of the ascending aorta, measuring 37 mm.  FINDINGS Left Ventricle: Left ventricular ejection fraction, by estimation, is 60 to 65%. The left ventricle has normal function. The left ventricle has no regional wall motion abnormalities. The left ventricular internal cavity size was normal in size. There is no left ventricular hypertrophy. Left ventricular diastolic parameters are consistent with Grade I diastolic dysfunction (impaired relaxation).  Right Ventricle: The right ventricular size is normal. No increase in right ventricular wall thickness. Right ventricular systolic function is normal. There is normal pulmonary artery systolic pressure. The tricuspid regurgitant velocity is 2.42 m/s, and with an assumed right atrial pressure of 3 mmHg, the estimated right ventricular systolic pressure is 26.4 mmHg.  Left Atrium: Left atrial size was normal in size.  Right Atrium: Right atrial size was normal in size.  Pericardium: There is no evidence of pericardial effusion.  Mitral Valve: The mitral valve is normal in structure. No evidence of mitral valve regurgitation. No evidence of mitral valve stenosis.  Tricuspid Valve: The tricuspid valve is normal in structure. Tricuspid valve regurgitation is trivial. No evidence of tricuspid stenosis.  Aortic Valve: The aortic valve is normal in structure. Aortic valve regurgitation is mild. Aortic regurgitation PHT measures 746 msec. Mild to moderate aortic valve sclerosis/calcification is present, without any evidence of aortic stenosis. Aortic valve mean gradient measures 10.0 mmHg. Aortic valve peak gradient measures 20.1 mmHg. Aortic valve area, by VTI  measures 1.41 cm.  Pulmonic Valve: The pulmonic valve was normal in structure. Pulmonic valve regurgitation is not visualized. No evidence of pulmonic stenosis.  Aorta: The aortic root is normal in size and structure. There is mild dilatation of the ascending aorta, measuring 37 mm.  Venous: The inferior vena cava is normal in size with greater than 50% respiratory variability, suggesting right atrial pressure of 3 mmHg.  IAS/Shunts: No atrial level shunt detected by color flow Doppler.   LEFT  VENTRICLE PLAX 2D LVIDd:         5.30 cm  Diastology LVIDs:         3.40 cm  LV e' medial:    7.94 cm/s LV PW:         1.10 cm  LV E/e' medial:  11.6 LV IVS:        1.10 cm  LV e' lateral:   7.51 cm/s LVOT diam:     2.00 cm  LV E/e' lateral: 12.3 LV SV:         59 LV SV Index:   29 LVOT Area:     3.14 cm   RIGHT VENTRICLE             IVC RV S prime:     12.10 cm/s  IVC diam: 1.40 cm TAPSE (M-mode): 2.6 cm  LEFT ATRIUM             Index       RIGHT ATRIUM           Index LA diam:        3.30 cm 1.61 cm/m  RA Area:     13.40 cm LA Vol (A2C):   41.1 ml 20.04 ml/m RA Volume:   30.60 ml  14.92 ml/m LA Vol (A4C):   46.5 ml 22.67 ml/m LA Biplane Vol: 45.7 ml 22.28 ml/m AORTIC VALVE AV Area (Vmax):    1.27 cm AV Area (Vmean):   1.34 cm AV Area (VTI):     1.41 cm AV Vmax:           224.00 cm/s AV Vmean:          151.000 cm/s AV VTI:            0.418 m AV Peak Grad:      20.1 mmHg AV Mean Grad:      10.0 mmHg LVOT Vmax:         90.70 cm/s LVOT Vmean:        64.500 cm/s LVOT VTI:          0.188 m LVOT/AV VTI ratio: 0.45 AI PHT:            746 msec  AORTA Ao Root diam: 3.40 cm Ao Asc diam:  3.70 cm Ao Desc diam: 2.30 cm  MITRAL VALVE               TRICUSPID VALVE MV Area (PHT): 3.28 cm    TR Peak grad:   23.4 mmHg MV Decel Time: 231 msec    TR Vmax:        242.00 cm/s MV E velocity: 92.50 cm/s MV A velocity: 73.70 cm/s  SHUNTS MV E/A ratio:  1.26        Systemic VTI:   0.19 m Systemic Diam: 2.00 cm  Belva Crome MD Electronically signed by Belva Crome MD Signature Date/Time: 07/17/2020/12:21:47 PM    Final    MONITORS  CARDIAC EVENT MONITOR 12/23/2021  Narrative [name], DOB 1945/12/30, MRN [id number]  EVENT MONITOR REPORT:   Patient was monitored from 11/16/2021 to 12/15/2021. Indication:                    Palpitations Ordering physician:  Garwin Brothers, MD Referring physician:  Garwin Brothers, MD   Baseline rhythm: Sinus  Minimum heart rate: 44 BPM.   Maximal heart rate 145 BPM.  Atrial arrhythmia: None significant  Ventricular arrhythmia: None significant  Conduction abnormality:  None significant  Symptoms: None   Conclusion: Event monitoring was within normal limits.  Interpreting  cardiologist: Garwin Brothers, MD Date: 12/23/2021 5:44 PM            EKG:  EKG is  ordered today.  The ekg ordered today demonstrates sinus rhythm with PAC, heart rate 65 bpm.  Recent Labs: No results found for requested labs within last 365 days.  Recent Lipid Panel    Component Value Date/Time   CHOL 119 11/20/2020 0823   TRIG 82 11/20/2020 0823   HDL 42 11/20/2020 0823   CHOLHDL 2.8 11/20/2020 0823   LDLCALC 61 11/20/2020 0823     Risk Assessment/Calculations:                Physical Exam:    VS:  BP 130/60 (BP Location: Left Arm, Patient Position: Sitting, Cuff Size: Normal)   Pulse 65   Ht 5\' 9"  (1.753 m)   Wt 193 lb (87.5 kg)   SpO2 98%   BMI 28.50 kg/m     Wt Readings from Last 3 Encounters:  09/14/22 193 lb (87.5 kg)  04/27/22 191 lb (86.6 kg)  11/25/21 195 lb (88.5 kg)     GEN:  Well nourished, well developed in no acute distress HEENT: Normal NECK: No JVD; No carotid bruits LYMPHATICS: No lymphadenopathy CARDIAC: RRR, 3/6 systolic murmur, rubs, gallops RESPIRATORY:  Clear to auscultation without rales, wheezing or rhonchi  ABDOMEN: Soft, non-tender, non-distended MUSCULOSKELETAL:  No  edema; No deformity  SKIN: Warm and dry NEUROLOGIC:  Alert and oriented x 3 PSYCHIATRIC:  Normal affect   ASSESSMENT:    1. Coronary artery disease involving native coronary artery of native heart without angina pectoris   2. Essential hypertension   3. Mixed hyperlipidemia   4. Aortic valve stenosis, etiology of cardiac valve disease unspecified   5. Palpitations    PLAN:    In order of problems listed above:  CAD-nonobstructive per [facility] in 2020. Stable with no anginal symptoms. No indication for ischemic evaluation.  Continue aspirin 81 mg daily, continue Toprol 50 mg daily, continue nitroglycerin as needed--has not needed, continue Zocor. Palpitations-noticed that his heart rate was fluctuating on his wife's pulse ox along with profound fatigue, stated his heart rate was fluctuating from the 60s to 90s.  Will arrange for 14-day ZIO. Will check BMET, CBC, magnesium, TSH. Hypertension-blood pressure is 130/60 today currently controlled, continue metoprolol 50 mg daily. Aortic valve stenosis-3/6 systolic murmur appreciated, he is asymptomatic, mild to moderate per most recent echo in 2022.  Will repeat echocardiogram. Hyperlipidemia-most recent LDL was 61, continue Zocor.   Disposition-ZIO monitor x 14 days, labs were above, echo for aortic valve stenosis.        Medication Adjustments/Labs and Tests Ordered: Current medicines are reviewed at length with the patient today.  Concerns regarding medicines are outlined above.  Orders Placed This Encounter  Procedures   Basic metabolic panel   CBC with Differential/Platelet   Magnesium   TSH   T3, free   T4, free   LONG TERM MONITOR (3-14 DAYS)   EKG 12-Lead   ECHOCARDIOGRAM COMPLETE   No orders of the defined types were placed in this encounter.   Patient Instructions  Medication Instructions:  Your physician recommends that you continue on your current medications as directed. Please refer to the Current Medication list  given to you today.  *If you need a refill on your cardiac medications before your next appointment, please  call your pharmacy*   Lab Work: Your physician recommends that you return for lab work in: Today for a BMP, CBC, Magnesium, TSH, Free T3 & T4  If you have labs (blood work) drawn today and your tests are completely normal, you will receive your results only by: MyChart Message (if you have MyChart) OR A paper copy in the mail If you have any lab test that is abnormal or we need to change your treatment, we will call you to review the results.   Testing/Procedures: Your physician has requested that you have an echocardiogram. Echocardiography is a painless test that uses sound waves to create images of your heart. It provides your doctor with information about the size and shape of your heart and how well your heart's chambers and valves are working. This procedure takes approximately one hour. There are no restrictions for this procedure. Please do NOT wear cologne, perfume, aftershave, or lotions (deodorant is allowed). Please arrive 15 minutes prior to your appointment time.   You have been asked to wear a Zio Heart Monitor today. It is to be worn for 14 days. Please remove the monitor on  6/12 and mail back in the box provided.  If you have any questions about the monitor please call the company at [phone]     Follow-Up: At [facility], you and your health needs are our priority.  As part of our continuing mission to provide you with exceptional heart care, we have created designated Provider Care Teams.  These Care Teams include your primary Cardiologist (physician) and Advanced Practice Providers (APPs -  Physician Assistants and Nurse Practitioners) who all work together to provide you with the care you need, when you need it.  We recommend signing up for the patient portal called "MyChart".  Sign up information is provided on this After Visit Summary.  MyChart  is used to connect with patients for Virtual Visits (Telemedicine).  Patients are able to view lab/test results, encounter notes, upcoming appointments, etc.  Non-urgent messages can be sent to your provider as well.   To learn more about what you can do with MyChart, go to [email or web address].    Your next appointment:   6 week(s)  Provider:   Belva Crome, MD    Other Instructions     Signed, Flossie Dibble, NP  09/14/2022 12:59 PM    [facility]

## 2022-09-14 ENCOUNTER — Encounter: Payer: Self-pay | Admitting: Cardiology

## 2022-09-14 ENCOUNTER — Ambulatory Visit: Payer: PPO | Attending: Cardiology | Admitting: Cardiology

## 2022-09-14 ENCOUNTER — Ambulatory Visit: Payer: PPO | Attending: Cardiology

## 2022-09-14 VITALS — BP 130/60 | HR 65 | Ht 69.0 in | Wt 193.0 lb

## 2022-09-14 DIAGNOSIS — R002 Palpitations: Secondary | ICD-10-CM

## 2022-09-14 DIAGNOSIS — E782 Mixed hyperlipidemia: Secondary | ICD-10-CM | POA: Diagnosis not present

## 2022-09-14 DIAGNOSIS — I2510 Atherosclerotic heart disease of native coronary artery without angina pectoris: Secondary | ICD-10-CM | POA: Diagnosis not present

## 2022-09-14 DIAGNOSIS — I10 Essential (primary) hypertension: Secondary | ICD-10-CM

## 2022-09-14 DIAGNOSIS — I350 Nonrheumatic aortic (valve) stenosis: Secondary | ICD-10-CM | POA: Diagnosis not present

## 2022-09-14 NOTE — Patient Instructions (Addendum)
Medication Instructions:  Your physician recommends that you continue on your current medications as directed. Please refer to the Current Medication list given to you today.  *If you need a refill on your cardiac medications before your next appointment, please call your pharmacy*   Lab Work: Your physician recommends that you return for lab work in: Today for a BMP, CBC, Magnesium, TSH, Free T3 & T4  If you have labs (blood work) drawn today and your tests are completely normal, you will receive your results only by: MyChart Message (if you have MyChart) OR A paper copy in the mail If you have any lab test that is abnormal or we need to change your treatment, we will call you to review the results.   Testing/Procedures: Your physician has requested that you have an echocardiogram. Echocardiography is a painless test that uses sound waves to create images of your heart. It provides your doctor with information about the size and shape of your heart and how well your heart's chambers and valves are working. This procedure takes approximately one hour. There are no restrictions for this procedure. Please do NOT wear cologne, perfume, aftershave, or lotions (deodorant is allowed). Please arrive 15 minutes prior to your appointment time.   You have been asked to wear a Zio Heart Monitor today. It is to be worn for 14 days. Please remove the monitor on  6/12 and mail back in the box provided.  If you have any questions about the monitor please call the company at [phone]     Follow-Up: At [facility], you and your health needs are our priority.  As part of our continuing mission to provide you with exceptional heart care, we have created designated Provider Care Teams.  These Care Teams include your primary Cardiologist (physician) and Advanced Practice Providers (APPs -  Physician Assistants and Nurse Practitioners) who all work together to provide you with the care you need,  when you need it.  We recommend signing up for the patient portal called "MyChart".  Sign up information is provided on this After Visit Summary.  MyChart is used to connect with patients for Virtual Visits (Telemedicine).  Patients are able to view lab/test results, encounter notes, upcoming appointments, etc.  Non-urgent messages can be sent to your provider as well.   To learn more about what you can do with MyChart, go to [email or web address].    Your next appointment:   6 week(s)  Provider:   Belva Crome, MD    Other Instructions

## 2022-09-15 DIAGNOSIS — I350 Nonrheumatic aortic (valve) stenosis: Secondary | ICD-10-CM | POA: Diagnosis not present

## 2022-09-15 DIAGNOSIS — I10 Essential (primary) hypertension: Secondary | ICD-10-CM | POA: Diagnosis not present

## 2022-09-15 DIAGNOSIS — E782 Mixed hyperlipidemia: Secondary | ICD-10-CM | POA: Diagnosis not present

## 2022-09-15 DIAGNOSIS — I2510 Atherosclerotic heart disease of native coronary artery without angina pectoris: Secondary | ICD-10-CM | POA: Diagnosis not present

## 2022-09-15 DIAGNOSIS — R002 Palpitations: Secondary | ICD-10-CM | POA: Diagnosis not present

## 2022-09-16 ENCOUNTER — Telehealth: Payer: Self-pay

## 2022-09-16 DIAGNOSIS — D696 Thrombocytopenia, unspecified: Secondary | ICD-10-CM

## 2022-09-16 LAB — CBC WITH DIFFERENTIAL/PLATELET
Basophils Absolute: 0 x10E3/uL (ref 0.0–0.2)
Basos: 0 %
EOS (ABSOLUTE): 0.1 x10E3/uL (ref 0.0–0.4)
Eos: 3 %
Hematocrit: 41.4 % (ref 37.5–51.0)
Hemoglobin: 14.5 g/dL (ref 13.0–17.7)
Immature Grans (Abs): 0 x10E3/uL (ref 0.0–0.1)
Immature Granulocytes: 0 %
Lymphocytes Absolute: 1.7 x10E3/uL (ref 0.7–3.1)
Lymphs: 32 %
MCH: 32.4 pg (ref 26.6–33.0)
MCHC: 35 g/dL (ref 31.5–35.7)
MCV: 92 fL (ref 79–97)
Monocytes Absolute: 0.5 x10E3/uL (ref 0.1–0.9)
Monocytes: 9 %
Neutrophils Absolute: 2.9 x10E3/uL (ref 1.4–7.0)
Neutrophils: 56 %
Platelets: 106 x10E3/uL — ABNORMAL LOW (ref 150–450)
RBC: 4.48 x10E6/uL (ref 4.14–5.80)
RDW: 13.7 % (ref 11.6–15.4)
WBC: 5.2 x10E3/uL (ref 3.4–10.8)

## 2022-09-16 LAB — BASIC METABOLIC PANEL WITH GFR
BUN/Creatinine Ratio: 13 (ref 10–24)
BUN: 18 mg/dL (ref 8–27)
CO2: 24 mmol/L (ref 20–29)
Calcium: 9.5 mg/dL (ref 8.6–10.2)
Chloride: 101 mmol/L (ref 96–106)
Creatinine, Ser: 1.34 mg/dL — ABNORMAL HIGH (ref 0.76–1.27)
Glucose: 96 mg/dL (ref 70–99)
Potassium: 4.3 mmol/L (ref 3.5–5.2)
Sodium: 137 mmol/L (ref 134–144)
eGFR: 55 mL/min/1.73 — ABNORMAL LOW

## 2022-09-16 LAB — T4, FREE: Free T4: 1.37 ng/dL (ref 0.82–1.77)

## 2022-09-16 LAB — TSH: TSH: 1.95 u[IU]/mL (ref 0.450–4.500)

## 2022-09-16 LAB — T3, FREE: T3, Free: 2.6 pg/mL (ref 2.0–4.4)

## 2022-09-16 LAB — MAGNESIUM: Magnesium: 2.1 mg/dL (ref 1.6–2.3)

## 2022-09-16 NOTE — Telephone Encounter (Signed)
-----   Message from Flossie Dibble, NP sent at 09/16/2022  7:55 AM EDT ----- Duane Hamilton, Lab work shows stable electrolytes, you have some kidney dysfunction but it is stable for you. Blood count without signs of infection or anemia, although your platelets are slightly low---this would not cause fatigue.  Normal magnesium. Normal thyroid.  I do want to recheck your CBC to keep an eye on your platelet count, please return in 2 weeks so we can recheck that.   Overall, stable results. Best, Victorino Dike

## 2022-09-23 DIAGNOSIS — R002 Palpitations: Secondary | ICD-10-CM | POA: Diagnosis not present

## 2022-09-29 DIAGNOSIS — D696 Thrombocytopenia, unspecified: Secondary | ICD-10-CM | POA: Diagnosis not present

## 2022-09-30 LAB — CBC
Hematocrit: 43.4 % (ref 37.5–51.0)
Hemoglobin: 14.7 g/dL (ref 13.0–17.7)
MCH: 31.5 pg (ref 26.6–33.0)
MCHC: 33.9 g/dL (ref 31.5–35.7)
MCV: 93 fL (ref 79–97)
Platelets: 132 x10E3/uL — ABNORMAL LOW (ref 150–450)
RBC: 4.67 x10E6/uL (ref 4.14–5.80)
RDW: 13.8 % (ref 11.6–15.4)
WBC: 4.9 x10E3/uL (ref 3.4–10.8)

## 2022-10-03 ENCOUNTER — Telehealth: Payer: Self-pay

## 2022-10-03 MED ORDER — METOPROLOL SUCCINATE ER 100 MG PO TB24: 100.0000 mg | ORAL_TABLET | Freq: Every day | ORAL | 3 refills | Status: DC

## 2022-10-03 NOTE — Telephone Encounter (Signed)
-----   Message from Flossie Dibble, NP sent at 10/02/2022  4:31 PM EDT ----- Mr. Onate,  Your monitor showed that your average heart rate is 68 bpm, which is normal. You had quiet a bit of SVT, which are extra fast beats that come from the top part of your heart and what you are likely feeling. No atrial fibrillation and no worrisome arrhythmias--which is good news. Lets increase your metoprolol to 100 mg daily, this should help suppress it some.  Best, Victorino Dike

## 2022-10-03 NOTE — Telephone Encounter (Signed)
Viewed in MyChart Routed to PCP  

## 2022-10-10 ENCOUNTER — Ambulatory Visit: Payer: PPO | Attending: Cardiology

## 2022-10-10 DIAGNOSIS — I350 Nonrheumatic aortic (valve) stenosis: Secondary | ICD-10-CM | POA: Diagnosis not present

## 2022-10-10 LAB — ECHOCARDIOGRAM COMPLETE
AR max vel: 1.32 cm2
AV Area VTI: 1.33 cm2
AV Area mean vel: 1.27 cm2
AV Mean grad: 11 mmHg
AV Peak grad: 19.8 mmHg
Ao pk vel: 2.23 m/s
P 1/2 time: 708 ms
S' Lateral: 2.4 cm

## 2022-10-13 DIAGNOSIS — M5416 Radiculopathy, lumbar region: Secondary | ICD-10-CM | POA: Diagnosis not present

## 2022-10-27 ENCOUNTER — Ambulatory Visit: Payer: PPO | Attending: Cardiology | Admitting: Cardiology

## 2022-10-27 ENCOUNTER — Encounter: Payer: Self-pay | Admitting: Cardiology

## 2022-10-27 VITALS — BP 120/70 | HR 62 | Ht 69.0 in | Wt 194.0 lb

## 2022-10-27 DIAGNOSIS — E782 Mixed hyperlipidemia: Secondary | ICD-10-CM

## 2022-10-27 DIAGNOSIS — I2510 Atherosclerotic heart disease of native coronary artery without angina pectoris: Secondary | ICD-10-CM | POA: Diagnosis not present

## 2022-10-27 DIAGNOSIS — I350 Nonrheumatic aortic (valve) stenosis: Secondary | ICD-10-CM

## 2022-10-27 DIAGNOSIS — I4729 Other ventricular tachycardia: Secondary | ICD-10-CM | POA: Diagnosis not present

## 2022-10-27 DIAGNOSIS — I10 Essential (primary) hypertension: Secondary | ICD-10-CM

## 2022-10-27 DIAGNOSIS — N289 Disorder of kidney and ureter, unspecified: Secondary | ICD-10-CM

## 2022-10-27 NOTE — Progress Notes (Signed)
Cardiology Office Note:    Date:  10/27/2022   ID:  Duane Hamilton, DOB 1945-12-08, MRN [id number]  PCP:  Paulina Fusi, MD  Cardiologist:  Garwin Brothers, MD   Referring MD: Paulina Fusi, MD    ASSESSMENT:    1. Aortic valve stenosis, etiology of cardiac valve disease unspecified   2. Coronary artery disease involving native coronary artery of native heart without angina pectoris   3. Essential hypertension   4. Nonsustained ventricular tachycardia (HCC)   5. Renal insufficiency, mild   6. Mixed hyperlipidemia    PLAN:    In order of problems listed above:  Coronary artery disease: Secondary prevention stressed with the patient.  Importance of compliance with diet medication stressed and she vocalized understanding.  He was advised to walk at least half an hour a day on a daily basis and he promises to do so once his back gets better.. Essential hypertension: Blood pressure is stable and diet was emphasized.  Lifestyle modification urged Mixed dyslipidemia: On lipid-lowering medications followed by primary care.  Diet emphasized. Mild aortic stenosis: Medical management at this time. Patient will be seen in follow-up appointment in 6 months or earlier if the patient has any concerns.    Medication Adjustments/Labs and Tests Ordered: Current medicines are reviewed at length with the patient today.  Concerns regarding medicines are outlined above.  No orders of the defined types were placed in this encounter.  No orders of the defined types were placed in this encounter.    Chief Complaint  Patient presents with   Follow-up     History of Present Illness:    Duane Hamilton is a 77 y.o. male.  Patient has past medical history of mild aortic stenosis, coronary artery disease, essential hypertension, mixed dyslipidemia.  He denies any problems at this time and takes care of activities of daily living.  No chest pain orthopnea or PND.  He has had some issues  with sciatica but before that he was walking half an hour without any problems.  At the time of my evaluation, the patient is alert awake oriented and in no distress.  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    cetirizine (ZYRTEC) 10 MG tablet Take 10 mg by mouth at bedtime.   clotrimazole-betamethasone (LOTRISONE) cream Apply 1 Application topically at bedtime.   finasteride (PROSCAR) 5 MG tablet Take 5 mg by mouth daily.   levothyroxine (SYNTHROID, LEVOTHROID) 75 MCG tablet Take 75 mcg by mouth daily before breakfast.  metoprolol succinate (TOPROL-XL) 100 MG 24 hr tablet Take 1 tablet (100 mg total) by mouth daily.   nitroGLYCERIN (NITROSTAT) 0.4 MG SL tablet Place 1 tablet (0.4 mg total) under the tongue every 5 (five) minutes as needed for chest pain.   pantoprazole (PROTONIX) 40 MG tablet Take 40 mg by mouth daily.   simvastatin (ZOCOR) 40 MG tablet Take 40 mg by mouth daily at 6 PM.    tamsulosin (FLOMAX) 0.4 MG CAPS capsule Take 0.4 mg by mouth daily.      Allergies:    Lisinopril, Sulfa antibiotics, and Tizanidine   Social History   Socioeconomic History   Marital status: Married    Spouse name: Not on file   Number of children: Not on file   Years of education: Not on file   Highest education level: Not on file  Occupational History   Not on file  Tobacco Use   Smoking status: Never   Smokeless tobacco: Never  Vaping Use   Vaping status: Never Used  Substance and Sexual Activity   Alcohol use: No   Drug use: No   Sexual activity: Not on file  Other Topics Concern   Not on file  Social History Narrative   Not on file   Social Determinants of Health   Financial Resource Strain: Not on file  Food Insecurity: Not on file  Transportation Needs: Not on file  Physical Activity: Not on file  Stress: Not on file  Social Connections: Not on file     Family History: The patient's family history includes Atrial fibrillation in his father; Cancer in his mother; Heart disease in his sister; Hypertension in his mother.  ROS:   Please see the history of present illness.    All other systems reviewed and are negative.  EKGs/Labs/Other Studies Reviewed:    The following studies were reviewed today: I discussed my findings with the patient at length   Recent Labs: 09/15/2022: BUN 18; Creatinine, Ser 1.34; Magnesium 2.1; Potassium 4.3; Sodium 137; TSH 1.950 09/29/2022: Hemoglobin 14.7; Platelets 132  Recent Lipid Panel    Component Value Date/Time   CHOL 119 11/20/2020 0823   TRIG 82 11/20/2020 0823   HDL 42 11/20/2020 0823   CHOLHDL 2.8 11/20/2020 0823   LDLCALC 61 11/20/2020 0823    Physical Exam:    VS:  BP 120/70 (BP Location: Right Arm, Patient Position: Sitting, Cuff Size: Normal)   Pulse 62   Ht 5\' 9"  (1.753 m)   Wt 194 lb (88 kg)   SpO2 97%   BMI 28.65 kg/m     Wt Readings from Last 3 Encounters:  10/27/22 194 lb (88 kg)  09/14/22 193 lb (87.5 kg)  04/27/22 191 lb (86.6 kg)     GEN: Patient is in no acute  distress HEENT: Normal NECK: No JVD; No carotid bruits LYMPHATICS: No lymphadenopathy CARDIAC: Hear sounds regular, 2/6 systolic murmur at the apex and aortic area. RESPIRATORY:  Clear to auscultation without rales, wheezing or rhonchi  ABDOMEN: Soft, non-tender, non-distended MUSCULOSKELETAL:  No edema; No deformity  SKIN: Warm and dry NEUROLOGIC:  Alert and oriented x 3 PSYCHIATRIC:  Normal affect   Signed, Garwin Brothers, MD  10/27/2022 3:50 PM    [facility]

## 2022-10-27 NOTE — Patient Instructions (Signed)
Medication Instructions:  Your physician recommends that you continue on your current medications as directed. Please refer to the Current Medication list given to you today.  *If you need a refill on your cardiac medications before your next appointment, please call your pharmacy*   Lab Work: None If you have labs (blood work) drawn today and your tests are completely normal, you will receive your results only by: MyChart Message (if you have MyChart) OR A paper copy in the mail If you have any lab test that is abnormal or we need to change your treatment, we will call you to review the results.   Testing/Procedures: None   Follow-Up: At  [facility], you and your health needs are our priority.  As part of our continuing mission to provide you with exceptional heart care, we have created designated Provider Care Teams.  These Care Teams include your primary Cardiologist (physician) and Advanced Practice Providers (APPs -  Physician Assistants and Nurse Practitioners) who all work together to provide you with the care you need, when you need it.  We recommend signing up for the patient portal called "MyChart".  Sign up information is provided on this After Visit Summary.  MyChart is used to connect with patients for Virtual Visits (Telemedicine).  Patients are able to view lab/test results, encounter notes, upcoming appointments, etc.  Non-urgent messages can be sent to your provider as well.   To learn more about what you can do with MyChart, go to [email or web address].    Your next appointment:   9 month(s)  Provider:   Rajan Revankar, MD    Other Instructions None  

## 2022-10-28 DIAGNOSIS — H268 Other specified cataract: Secondary | ICD-10-CM | POA: Diagnosis not present

## 2022-10-28 DIAGNOSIS — Z01818 Encounter for other preprocedural examination: Secondary | ICD-10-CM | POA: Diagnosis not present

## 2022-10-28 DIAGNOSIS — H25813 Combined forms of age-related cataract, bilateral: Secondary | ICD-10-CM | POA: Diagnosis not present

## 2022-10-28 DIAGNOSIS — H25812 Combined forms of age-related cataract, left eye: Secondary | ICD-10-CM | POA: Diagnosis not present

## 2022-11-09 ENCOUNTER — Ambulatory Visit: Payer: PPO | Attending: Cardiology

## 2022-11-09 ENCOUNTER — Telehealth: Payer: Self-pay | Admitting: Cardiology

## 2022-11-09 VITALS — BP 120/72 | HR 103 | Ht 69.0 in | Wt 195.6 lb

## 2022-11-09 DIAGNOSIS — I4729 Other ventricular tachycardia: Secondary | ICD-10-CM

## 2022-11-09 DIAGNOSIS — R001 Bradycardia, unspecified: Secondary | ICD-10-CM | POA: Diagnosis not present

## 2022-11-09 NOTE — Telephone Encounter (Signed)
STAT if HR is under 50 or over 120 (normal HR is 60-100 beats per minute)  What is your heart rate? 97 right now  Do you have a log of your heart rate readings (document readings)? States HR has been ranging from 98-115  Do you have any other symptoms? Patient states he feels weak.  He is wondering if he should take an additional metoprolol succinate (TOPROL-XL) 100 MG 24 hr tablet.  He states he took one already this morning.  Please advise.

## 2022-11-09 NOTE — Telephone Encounter (Addendum)
Patient will be here at 2:15 for ECG per [facility].

## 2022-11-09 NOTE — Progress Notes (Signed)
   Nurse Visit   Date of Encounter: 11/09/2022 ID: RUFFUS KAMAKA, DOB 08-Dec-1945, MRN [id number]  PCP:  Paulina Fusi, MD   [facility] Providers Cardiologist:  None      Visit Details   VS:  There were no vitals taken for this visit. , BMI There is no height or weight on file to calculate BMI.  Wt Readings from Last 3 Encounters:  10/27/22 194 lb (88 kg)  09/14/22 193 lb (87.5 kg)  04/27/22 191 lb (86.6 kg)     Reason for visit: HR irregular- already taken Metoprolol today- advised per Dr. Bing Matter to come for EKG Performed today: EKG, Vitals Changes (medications, testing, etc.) : TSH, CBC, ProBNP today Length of Visit:  minutes    Medications Adjustments/Labs and Tests Ordered: Orders Placed This Encounter  Procedures   EKG 12-Lead   No orders of the defined types were placed in this encounter.    Signed, Neena Rhymes, RN  11/09/2022 2:15 PM

## 2022-11-10 LAB — CBC
Hematocrit: 42 % (ref 37.5–51.0)
Hemoglobin: 14.6 g/dL (ref 13.0–17.7)
MCH: 30.9 pg (ref 26.6–33.0)
MCHC: 34.8 g/dL (ref 31.5–35.7)
MCV: 89 fL (ref 79–97)
Platelets: 135 10*3/uL — ABNORMAL LOW (ref 150–450)
RBC: 4.72 x10E6/uL (ref 4.14–5.80)
RDW: 13.6 % (ref 11.6–15.4)

## 2022-11-10 LAB — TSH: TSH: 2.05 u[IU]/mL (ref 0.450–4.500)

## 2022-11-10 LAB — PRO B NATRIURETIC PEPTIDE: NT-Pro BNP: 114 pg/mL (ref 0–486)

## 2022-11-11 ENCOUNTER — Telehealth: Payer: Self-pay | Admitting: Cardiology

## 2022-11-11 NOTE — Telephone Encounter (Signed)
Patient is requesting call back to go over results.  

## 2022-11-11 NOTE — Telephone Encounter (Signed)
Advised to keep not eat chocolate. Labs reviewed and advised no other changes.

## 2022-11-15 DIAGNOSIS — H25812 Combined forms of age-related cataract, left eye: Secondary | ICD-10-CM | POA: Diagnosis not present

## 2022-11-15 DIAGNOSIS — I10 Essential (primary) hypertension: Secondary | ICD-10-CM | POA: Diagnosis not present

## 2022-11-15 DIAGNOSIS — H259 Unspecified age-related cataract: Secondary | ICD-10-CM | POA: Diagnosis not present

## 2022-11-21 DIAGNOSIS — K2100 Gastro-esophageal reflux disease with esophagitis, without bleeding: Secondary | ICD-10-CM | POA: Diagnosis not present

## 2022-11-21 DIAGNOSIS — Z86010 Personal history of colonic polyps: Secondary | ICD-10-CM | POA: Diagnosis not present

## 2022-11-21 DIAGNOSIS — Z8719 Personal history of other diseases of the digestive system: Secondary | ICD-10-CM | POA: Diagnosis not present

## 2022-11-30 DIAGNOSIS — L570 Actinic keratosis: Secondary | ICD-10-CM | POA: Diagnosis not present

## 2022-11-30 DIAGNOSIS — M47816 Spondylosis without myelopathy or radiculopathy, lumbar region: Secondary | ICD-10-CM | POA: Diagnosis not present

## 2022-11-30 DIAGNOSIS — M25551 Pain in right hip: Secondary | ICD-10-CM | POA: Diagnosis not present

## 2022-11-30 DIAGNOSIS — G8929 Other chronic pain: Secondary | ICD-10-CM | POA: Diagnosis not present

## 2022-11-30 DIAGNOSIS — M5137 Other intervertebral disc degeneration, lumbosacral region: Secondary | ICD-10-CM | POA: Diagnosis not present

## 2022-11-30 DIAGNOSIS — M5416 Radiculopathy, lumbar region: Secondary | ICD-10-CM | POA: Diagnosis not present

## 2022-12-12 DIAGNOSIS — M47816 Spondylosis without myelopathy or radiculopathy, lumbar region: Secondary | ICD-10-CM | POA: Diagnosis not present

## 2022-12-12 DIAGNOSIS — M4316 Spondylolisthesis, lumbar region: Secondary | ICD-10-CM | POA: Diagnosis not present

## 2022-12-26 DIAGNOSIS — M545 Low back pain, unspecified: Secondary | ICD-10-CM | POA: Diagnosis not present

## 2022-12-28 DIAGNOSIS — M545 Low back pain, unspecified: Secondary | ICD-10-CM | POA: Diagnosis not present

## 2022-12-30 DIAGNOSIS — M545 Low back pain, unspecified: Secondary | ICD-10-CM | POA: Diagnosis not present

## 2023-01-02 DIAGNOSIS — E663 Overweight: Secondary | ICD-10-CM | POA: Diagnosis not present

## 2023-01-02 DIAGNOSIS — E785 Hyperlipidemia, unspecified: Secondary | ICD-10-CM | POA: Diagnosis not present

## 2023-01-02 DIAGNOSIS — I10 Essential (primary) hypertension: Secondary | ICD-10-CM | POA: Diagnosis not present

## 2023-01-02 DIAGNOSIS — N400 Enlarged prostate without lower urinary tract symptoms: Secondary | ICD-10-CM | POA: Diagnosis not present

## 2023-01-02 DIAGNOSIS — Z7982 Long term (current) use of aspirin: Secondary | ICD-10-CM | POA: Diagnosis not present

## 2023-01-02 DIAGNOSIS — K219 Gastro-esophageal reflux disease without esophagitis: Secondary | ICD-10-CM | POA: Diagnosis not present

## 2023-01-02 DIAGNOSIS — I2510 Atherosclerotic heart disease of native coronary artery without angina pectoris: Secondary | ICD-10-CM | POA: Diagnosis not present

## 2023-01-02 DIAGNOSIS — E039 Hypothyroidism, unspecified: Secondary | ICD-10-CM | POA: Diagnosis not present

## 2023-01-02 DIAGNOSIS — H3530 Unspecified macular degeneration: Secondary | ICD-10-CM | POA: Diagnosis not present

## 2023-01-02 DIAGNOSIS — M545 Low back pain, unspecified: Secondary | ICD-10-CM | POA: Diagnosis not present

## 2023-01-02 DIAGNOSIS — G8929 Other chronic pain: Secondary | ICD-10-CM | POA: Diagnosis not present

## 2023-01-04 DIAGNOSIS — M545 Low back pain, unspecified: Secondary | ICD-10-CM | POA: Diagnosis not present

## 2023-01-06 DIAGNOSIS — M545 Low back pain, unspecified: Secondary | ICD-10-CM | POA: Diagnosis not present

## 2023-01-10 DIAGNOSIS — M545 Low back pain, unspecified: Secondary | ICD-10-CM | POA: Diagnosis not present

## 2023-01-12 DIAGNOSIS — M545 Low back pain, unspecified: Secondary | ICD-10-CM | POA: Diagnosis not present

## 2023-01-16 DIAGNOSIS — M545 Low back pain, unspecified: Secondary | ICD-10-CM | POA: Diagnosis not present

## 2023-01-19 DIAGNOSIS — M545 Low back pain, unspecified: Secondary | ICD-10-CM | POA: Diagnosis not present

## 2023-01-23 DIAGNOSIS — M545 Low back pain, unspecified: Secondary | ICD-10-CM | POA: Diagnosis not present

## 2023-01-24 DIAGNOSIS — M1990 Unspecified osteoarthritis, unspecified site: Secondary | ICD-10-CM | POA: Diagnosis not present

## 2023-01-24 DIAGNOSIS — H353111 Nonexudative age-related macular degeneration, right eye, early dry stage: Secondary | ICD-10-CM | POA: Diagnosis not present

## 2023-01-24 DIAGNOSIS — H25811 Combined forms of age-related cataract, right eye: Secondary | ICD-10-CM | POA: Diagnosis not present

## 2023-01-24 DIAGNOSIS — E039 Hypothyroidism, unspecified: Secondary | ICD-10-CM | POA: Diagnosis not present

## 2023-01-24 DIAGNOSIS — I252 Old myocardial infarction: Secondary | ICD-10-CM | POA: Diagnosis not present

## 2023-01-24 DIAGNOSIS — I10 Essential (primary) hypertension: Secondary | ICD-10-CM | POA: Diagnosis not present

## 2023-01-24 DIAGNOSIS — Z7982 Long term (current) use of aspirin: Secondary | ICD-10-CM | POA: Diagnosis not present

## 2023-01-24 DIAGNOSIS — K219 Gastro-esophageal reflux disease without esophagitis: Secondary | ICD-10-CM | POA: Diagnosis not present

## 2023-01-24 DIAGNOSIS — H353122 Nonexudative age-related macular degeneration, left eye, intermediate dry stage: Secondary | ICD-10-CM | POA: Diagnosis not present

## 2023-01-24 DIAGNOSIS — Z79899 Other long term (current) drug therapy: Secondary | ICD-10-CM | POA: Diagnosis not present

## 2023-01-24 DIAGNOSIS — H259 Unspecified age-related cataract: Secondary | ICD-10-CM | POA: Diagnosis not present

## 2023-01-24 DIAGNOSIS — E785 Hyperlipidemia, unspecified: Secondary | ICD-10-CM | POA: Diagnosis not present

## 2023-01-30 DIAGNOSIS — J309 Allergic rhinitis, unspecified: Secondary | ICD-10-CM | POA: Diagnosis not present

## 2023-01-31 DIAGNOSIS — Z0000 Encounter for general adult medical examination without abnormal findings: Secondary | ICD-10-CM | POA: Diagnosis not present

## 2023-01-31 DIAGNOSIS — Z9181 History of falling: Secondary | ICD-10-CM | POA: Diagnosis not present

## 2023-02-03 DIAGNOSIS — N401 Enlarged prostate with lower urinary tract symptoms: Secondary | ICD-10-CM | POA: Diagnosis not present

## 2023-02-06 DIAGNOSIS — M4316 Spondylolisthesis, lumbar region: Secondary | ICD-10-CM | POA: Diagnosis not present

## 2023-02-07 DIAGNOSIS — Z23 Encounter for immunization: Secondary | ICD-10-CM | POA: Diagnosis not present

## 2023-02-07 DIAGNOSIS — E039 Hypothyroidism, unspecified: Secondary | ICD-10-CM | POA: Diagnosis not present

## 2023-02-07 DIAGNOSIS — K222 Esophageal obstruction: Secondary | ICD-10-CM | POA: Diagnosis not present

## 2023-02-07 DIAGNOSIS — I10 Essential (primary) hypertension: Secondary | ICD-10-CM | POA: Diagnosis not present

## 2023-02-07 DIAGNOSIS — N411 Chronic prostatitis: Secondary | ICD-10-CM | POA: Diagnosis not present

## 2023-02-07 DIAGNOSIS — R7301 Impaired fasting glucose: Secondary | ICD-10-CM | POA: Diagnosis not present

## 2023-02-07 DIAGNOSIS — K219 Gastro-esophageal reflux disease without esophagitis: Secondary | ICD-10-CM | POA: Diagnosis not present

## 2023-02-07 DIAGNOSIS — E785 Hyperlipidemia, unspecified: Secondary | ICD-10-CM | POA: Diagnosis not present

## 2023-02-07 DIAGNOSIS — N401 Enlarged prostate with lower urinary tract symptoms: Secondary | ICD-10-CM | POA: Diagnosis not present

## 2023-02-07 DIAGNOSIS — I2510 Atherosclerotic heart disease of native coronary artery without angina pectoris: Secondary | ICD-10-CM | POA: Diagnosis not present

## 2023-02-09 DIAGNOSIS — Z96652 Presence of left artificial knee joint: Secondary | ICD-10-CM | POA: Diagnosis not present

## 2023-02-09 DIAGNOSIS — Z96651 Presence of right artificial knee joint: Secondary | ICD-10-CM | POA: Diagnosis not present

## 2023-02-09 DIAGNOSIS — Z96653 Presence of artificial knee joint, bilateral: Secondary | ICD-10-CM | POA: Diagnosis not present

## 2023-02-14 DIAGNOSIS — M545 Low back pain, unspecified: Secondary | ICD-10-CM | POA: Diagnosis not present

## 2023-02-16 DIAGNOSIS — M545 Low back pain, unspecified: Secondary | ICD-10-CM | POA: Diagnosis not present

## 2023-02-21 DIAGNOSIS — M545 Low back pain, unspecified: Secondary | ICD-10-CM | POA: Diagnosis not present

## 2023-02-23 DIAGNOSIS — M545 Low back pain, unspecified: Secondary | ICD-10-CM | POA: Diagnosis not present

## 2023-02-27 DIAGNOSIS — M545 Low back pain, unspecified: Secondary | ICD-10-CM | POA: Diagnosis not present

## 2023-03-03 DIAGNOSIS — M545 Low back pain, unspecified: Secondary | ICD-10-CM | POA: Diagnosis not present

## 2023-03-06 DIAGNOSIS — N411 Chronic prostatitis: Secondary | ICD-10-CM | POA: Diagnosis not present

## 2023-03-06 DIAGNOSIS — N401 Enlarged prostate with lower urinary tract symptoms: Secondary | ICD-10-CM | POA: Diagnosis not present

## 2023-03-07 DIAGNOSIS — M545 Low back pain, unspecified: Secondary | ICD-10-CM | POA: Diagnosis not present

## 2023-03-10 DIAGNOSIS — M545 Low back pain, unspecified: Secondary | ICD-10-CM | POA: Diagnosis not present

## 2023-03-21 DIAGNOSIS — M545 Low back pain, unspecified: Secondary | ICD-10-CM | POA: Diagnosis not present

## 2023-03-31 DIAGNOSIS — B356 Tinea cruris: Secondary | ICD-10-CM | POA: Diagnosis not present

## 2023-05-12 DIAGNOSIS — B356 Tinea cruris: Secondary | ICD-10-CM | POA: Diagnosis not present

## 2023-05-17 DIAGNOSIS — L820 Inflamed seborrheic keratosis: Secondary | ICD-10-CM | POA: Diagnosis not present

## 2023-05-17 DIAGNOSIS — L570 Actinic keratosis: Secondary | ICD-10-CM | POA: Diagnosis not present

## 2023-06-13 DIAGNOSIS — L820 Inflamed seborrheic keratosis: Secondary | ICD-10-CM | POA: Diagnosis not present

## 2023-07-04 DIAGNOSIS — N401 Enlarged prostate with lower urinary tract symptoms: Secondary | ICD-10-CM | POA: Diagnosis not present

## 2023-07-04 DIAGNOSIS — N5089 Other specified disorders of the male genital organs: Secondary | ICD-10-CM | POA: Diagnosis not present

## 2023-07-04 DIAGNOSIS — R21 Rash and other nonspecific skin eruption: Secondary | ICD-10-CM | POA: Diagnosis not present

## 2023-07-12 DIAGNOSIS — J111 Influenza due to unidentified influenza virus with other respiratory manifestations: Secondary | ICD-10-CM | POA: Diagnosis not present

## 2023-07-12 DIAGNOSIS — R509 Fever, unspecified: Secondary | ICD-10-CM | POA: Diagnosis not present

## 2023-07-12 DIAGNOSIS — U071 COVID-19: Secondary | ICD-10-CM | POA: Diagnosis not present

## 2023-07-27 DIAGNOSIS — J301 Allergic rhinitis due to pollen: Secondary | ICD-10-CM | POA: Diagnosis not present

## 2023-07-31 ENCOUNTER — Ambulatory Visit: Payer: PPO | Attending: Cardiology | Admitting: Cardiology

## 2023-07-31 ENCOUNTER — Encounter: Payer: Self-pay | Admitting: Cardiology

## 2023-07-31 VITALS — BP 126/60 | HR 68 | Ht 69.0 in | Wt 196.8 lb

## 2023-07-31 DIAGNOSIS — R011 Cardiac murmur, unspecified: Secondary | ICD-10-CM

## 2023-07-31 DIAGNOSIS — I2510 Atherosclerotic heart disease of native coronary artery without angina pectoris: Secondary | ICD-10-CM

## 2023-07-31 DIAGNOSIS — E782 Mixed hyperlipidemia: Secondary | ICD-10-CM | POA: Diagnosis not present

## 2023-07-31 DIAGNOSIS — I10 Essential (primary) hypertension: Secondary | ICD-10-CM

## 2023-07-31 DIAGNOSIS — I4729 Other ventricular tachycardia: Secondary | ICD-10-CM

## 2023-07-31 MED ORDER — METOPROLOL SUCCINATE ER 100 MG PO TB24: 100.0000 mg | ORAL_TABLET | Freq: Every day | ORAL | 3 refills | Status: AC

## 2023-07-31 NOTE — Patient Instructions (Signed)
 Medication Instructions:  Your physician recommends that you continue on your current medications as directed. Please refer to the Current Medication list given to you today.  *If you need a refill on your cardiac medications before your next appointment, please call your pharmacy*   Lab Work: None Ordered If you have labs (blood work) drawn today and your tests are completely normal, you will receive your results only by: MyChart Message (if you have MyChart) OR A paper copy in the mail If you have any lab test that is abnormal or we need to change your treatment, we will call you to review the results.   Testing/Procedures: None Ordered   Follow-Up: At [facility], you and your health needs are our priority.  As part of our continuing mission to provide you with exceptional heart care, we have created designated Provider Care Teams.  These Care Teams include your primary Cardiologist (physician) and Advanced Practice Providers (APPs -  Physician Assistants and Nurse Practitioners) who all work together to provide you with the care you need, when you need it.  We recommend signing up for the patient portal called "MyChart".  Sign up information is provided on this After Visit Summary.  MyChart is used to connect with patients for Virtual Visits (Telemedicine).  Patients are able to view lab/test results, encounter notes, upcoming appointments, etc.  Non-urgent messages can be sent to your provider as well.   To learn more about what you can do with MyChart, go to [email or web address].    Your next appointment:   9 month follow up

## 2023-07-31 NOTE — Progress Notes (Signed)
 Cardiology Office Note:    Date:  07/31/2023   ID:  Duane Hamilton, DOB 01-04-1946, MRN [id number]  PCP:  Adrian Hopper, MD  Cardiologist:  Nelia Balzarine, MD   Referring MD: Adrian Hopper, MD    ASSESSMENT:    1. Coronary artery disease involving native coronary artery of native heart without angina pectoris   2. Murmur   3. Essential hypertension   4. Nonsustained ventricular tachycardia (HCC)   5. Mixed hyperlipidemia    PLAN:    In order of problems listed above:  Coronary artery disease: Secondary prevention stressed to the patient.  Importance of compliance with diet medication stressed and he vocalized understanding.  He was advised to walk at least half an hour a day on a daily basis and he promises to do so. Essential hypertension: Blood pressure is stable and diet was emphasized.  Lifestyle modifications stressed. Mixed dyslipidemia: On lipid-lowering medications followed by primary care.  Goal LDL less than 60. Obesity: Weight reduction stressed diet emphasized.  Obesity explained and he promises to do better. Cardiac murmur: Echocardiogram will be done to assess murmur heard on auscultation. Patient will be seen in follow-up appointment in 9 months or earlier if the patient has any concerns.    Medication Adjustments/Labs and Tests Ordered: Current medicines are reviewed at length with the patient today.  Concerns regarding medicines are outlined above.  Orders Placed This Encounter  Procedures   ECHOCARDIOGRAM COMPLETE   Meds ordered this encounter  Medications   metoprolol succinate (TOPROL-XL) 100 MG 24 hr tablet    Sig: Take 1 tablet (100 mg total) by mouth daily.    Dispense:  90 tablet    Refill:  3     No chief complaint on file.    History of Present Illness:    Duane Hamilton is a 78 y.o. male.  Patient has past medical history of coronary artery disease, aortic sclerosis, essential hypertension and mixed dyslipidemia and history  of nonsustained ventricular tachycardia.  He denies any problems at this time and takes care of activities of daily living.  No chest pain orthopnea or PND.  At the time of my evaluation, the patient is alert awake oriented and in no distress.  He had COVID so he is not actively exercising on a regular basis but plans to.  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    cetirizine (ZYRTEC) 10 MG tablet Take 10 mg by mouth at bedtime.   clotrimazole-betamethasone (LOTRISONE) cream Apply 1 Application topically at  bedtime.   finasteride (PROSCAR) 5 MG tablet Take 5 mg by mouth daily.   levothyroxine (SYNTHROID, LEVOTHROID) 75 MCG tablet Take 75 mcg by mouth daily before breakfast.    nitroGLYCERIN (NITROSTAT) 0.4 MG SL tablet Place 1 tablet (0.4 mg total) under the tongue every 5 (five) minutes as needed for chest pain.   pantoprazole (PROTONIX) 40 MG tablet Take 40 mg by mouth daily.   simvastatin (ZOCOR) 40 MG tablet Take 40 mg by mouth daily at 6 PM.    tamsulosin (FLOMAX) 0.4 MG CAPS  capsule Take 0.4 mg by mouth daily.    [DISCONTINUED] metoprolol succinate (TOPROL-XL) 100 MG 24 hr tablet Take 1 tablet (100 mg total) by mouth daily.     Allergies:   Lisinopril, Sulfa antibiotics, and Tizanidine   Social History   Socioeconomic History   Marital status: Married    Spouse name: Not on file   Number of children: Not on file   Years of education: Not on file   Highest education level: Not on file  Occupational History   Not on file  Tobacco Use   Smoking status: Never   Smokeless tobacco: Never  Vaping Use   Vaping status: Never Used  Substance and Sexual Activity   Alcohol use: No   Drug use: No   Sexual activity: Not on file  Other Topics Concern   Not on file  Social History Narrative   Not on file   Social Drivers of Health   Financial Resource Strain: Not on file  Food Insecurity: Not on file  Transportation Needs: Not on file  Physical Activity: Not on file  Stress: Not on file  Social Connections: Not on file     Family History: The patient's family history includes Atrial fibrillation in his father; Cancer in his mother; Heart disease in his sister; Hypertension in his mother.  ROS:   Please see the history of present illness.    All other systems reviewed and are negative.  EKGs/Labs/Other Studies Reviewed:    The following studies were reviewed today: I discussed my findings with the patient at length   Recent Labs: 09/15/2022: BUN 18; Creatinine, Ser 1.34; Magnesium 2.1; Potassium 4.3; Sodium 137 11/09/2022: Hemoglobin 14.6; NT-Pro BNP 114; Platelets 135; TSH 2.050  Recent Lipid Panel    Component Value Date/Time   CHOL 119 11/20/2020 0823   TRIG 82 11/20/2020 0823   HDL 42 11/20/2020 0823   CHOLHDL 2.8 11/20/2020 0823   LDLCALC 61 11/20/2020 0823    Physical Exam:    VS:  BP 126/60   Pulse 68   Ht 5\' 9"  (1.753 m)   Wt 196 lb 12.8 oz (89.3 kg)   SpO2 98%   BMI 29.06 kg/m     Wt Readings from Last 3 Encounters:   07/31/23 196 lb 12.8 oz (89.3 kg)  11/09/22 195 lb 9.6 oz (88.7 kg)  10/27/22 194 lb (88 kg)     GEN: Patient is in no acute distress HEENT: Normal NECK: No JVD; No carotid bruits LYMPHATICS: No lymphadenopathy CARDIAC: Hear sounds regular, 2/6 systolic murmur at the apex. RESPIRATORY:  Clear to auscultation without rales, wheezing or rhonchi  ABDOMEN: Soft, non-tender, non-distended MUSCULOSKELETAL:  No edema; No deformity  SKIN: Warm and dry NEUROLOGIC:  Alert and oriented x 3 PSYCHIATRIC:  Normal affect   Signed, Garwin Brothers, MD  07/31/2023 10:27 AM    [facility]

## 2023-08-08 DIAGNOSIS — I10 Essential (primary) hypertension: Secondary | ICD-10-CM | POA: Diagnosis not present

## 2023-08-08 DIAGNOSIS — K222 Esophageal obstruction: Secondary | ICD-10-CM | POA: Diagnosis not present

## 2023-08-08 DIAGNOSIS — E785 Hyperlipidemia, unspecified: Secondary | ICD-10-CM | POA: Diagnosis not present

## 2023-08-08 DIAGNOSIS — Z6828 Body mass index (BMI) 28.0-28.9, adult: Secondary | ICD-10-CM | POA: Diagnosis not present

## 2023-08-08 DIAGNOSIS — R7301 Impaired fasting glucose: Secondary | ICD-10-CM | POA: Diagnosis not present

## 2023-08-08 DIAGNOSIS — E039 Hypothyroidism, unspecified: Secondary | ICD-10-CM | POA: Diagnosis not present

## 2023-08-08 DIAGNOSIS — K219 Gastro-esophageal reflux disease without esophagitis: Secondary | ICD-10-CM | POA: Diagnosis not present

## 2023-08-08 DIAGNOSIS — N411 Chronic prostatitis: Secondary | ICD-10-CM | POA: Diagnosis not present

## 2023-08-08 DIAGNOSIS — E538 Deficiency of other specified B group vitamins: Secondary | ICD-10-CM | POA: Diagnosis not present

## 2023-08-10 DIAGNOSIS — Z96651 Presence of right artificial knee joint: Secondary | ICD-10-CM | POA: Diagnosis not present

## 2023-08-10 DIAGNOSIS — Z96652 Presence of left artificial knee joint: Secondary | ICD-10-CM | POA: Diagnosis not present

## 2023-08-23 ENCOUNTER — Encounter (HOSPITAL_COMMUNITY): Payer: Self-pay

## 2023-08-30 ENCOUNTER — Ambulatory Visit: Attending: Cardiology

## 2023-08-30 DIAGNOSIS — I2510 Atherosclerotic heart disease of native coronary artery without angina pectoris: Secondary | ICD-10-CM

## 2023-08-30 DIAGNOSIS — R011 Cardiac murmur, unspecified: Secondary | ICD-10-CM | POA: Diagnosis not present

## 2023-09-01 LAB — ECHOCARDIOGRAM COMPLETE
AR max vel: 1.42 cm2
AV Area VTI: 1.35 cm2
AV Area mean vel: 1.29 cm2
AV Mean grad: 13.2 mmHg
AV Peak grad: 23.9 mmHg
Ao pk vel: 2.44 m/s
Area-P 1/2: 2.9 cm2
MV VTI: 2.08 cm2
P 1/2 time: 566 ms
S' Lateral: 3.4 cm

## 2023-09-05 ENCOUNTER — Ambulatory Visit: Payer: Self-pay | Admitting: Cardiology

## 2023-09-08 NOTE — Telephone Encounter (Signed)
 Left vm to return call and MyChart message

## 2023-09-08 NOTE — Telephone Encounter (Signed)
-----   Message from Micael Adas Revankar sent at 09/05/2023 11:43 PM EDT ----- Moderate diuretic stenosis.  Medical management.  EF is normal.  Copy primary care  Nelia Balzarine, MD 09/05/2023 11:42 PM

## 2023-09-13 DIAGNOSIS — B356 Tinea cruris: Secondary | ICD-10-CM | POA: Diagnosis not present

## 2023-09-13 DIAGNOSIS — R21 Rash and other nonspecific skin eruption: Secondary | ICD-10-CM | POA: Diagnosis not present

## 2023-10-11 DIAGNOSIS — N5089 Other specified disorders of the male genital organs: Secondary | ICD-10-CM | POA: Diagnosis not present

## 2023-10-11 DIAGNOSIS — N401 Enlarged prostate with lower urinary tract symptoms: Secondary | ICD-10-CM | POA: Diagnosis not present

## 2023-11-09 DIAGNOSIS — Z85820 Personal history of malignant melanoma of skin: Secondary | ICD-10-CM | POA: Diagnosis not present

## 2023-11-09 DIAGNOSIS — L814 Other melanin hyperpigmentation: Secondary | ICD-10-CM | POA: Diagnosis not present

## 2023-11-09 DIAGNOSIS — L570 Actinic keratosis: Secondary | ICD-10-CM | POA: Diagnosis not present

## 2023-11-09 DIAGNOSIS — L578 Other skin changes due to chronic exposure to nonionizing radiation: Secondary | ICD-10-CM | POA: Diagnosis not present

## 2023-11-09 DIAGNOSIS — D225 Melanocytic nevi of trunk: Secondary | ICD-10-CM | POA: Diagnosis not present

## 2023-12-14 DIAGNOSIS — S81012A Laceration without foreign body, left knee, initial encounter: Secondary | ICD-10-CM | POA: Diagnosis not present

## 2023-12-14 DIAGNOSIS — W19XXXA Unspecified fall, initial encounter: Secondary | ICD-10-CM | POA: Diagnosis not present

## 2023-12-14 DIAGNOSIS — Z23 Encounter for immunization: Secondary | ICD-10-CM | POA: Diagnosis not present

## 2023-12-14 DIAGNOSIS — R58 Hemorrhage, not elsewhere classified: Secondary | ICD-10-CM | POA: Diagnosis not present

## 2023-12-14 DIAGNOSIS — S139XXA Sprain of joints and ligaments of unspecified parts of neck, initial encounter: Secondary | ICD-10-CM | POA: Diagnosis not present

## 2023-12-14 DIAGNOSIS — S0081XA Abrasion of other part of head, initial encounter: Secondary | ICD-10-CM | POA: Diagnosis not present

## 2023-12-14 DIAGNOSIS — I10 Essential (primary) hypertension: Secondary | ICD-10-CM | POA: Diagnosis not present

## 2023-12-14 DIAGNOSIS — I252 Old myocardial infarction: Secondary | ICD-10-CM | POA: Diagnosis not present

## 2023-12-14 DIAGNOSIS — S299XXA Unspecified injury of thorax, initial encounter: Secondary | ICD-10-CM | POA: Diagnosis not present

## 2023-12-14 DIAGNOSIS — W108XXA Fall (on) (from) other stairs and steps, initial encounter: Secondary | ICD-10-CM | POA: Diagnosis not present

## 2023-12-14 DIAGNOSIS — S060X0A Concussion without loss of consciousness, initial encounter: Secondary | ICD-10-CM | POA: Diagnosis not present

## 2023-12-21 DIAGNOSIS — B356 Tinea cruris: Secondary | ICD-10-CM | POA: Diagnosis not present

## 2023-12-22 DIAGNOSIS — S0081XA Abrasion of other part of head, initial encounter: Secondary | ICD-10-CM | POA: Diagnosis not present

## 2023-12-22 DIAGNOSIS — S161XXA Strain of muscle, fascia and tendon at neck level, initial encounter: Secondary | ICD-10-CM | POA: Diagnosis not present

## 2023-12-22 DIAGNOSIS — W19XXXA Unspecified fall, initial encounter: Secondary | ICD-10-CM | POA: Diagnosis not present

## 2023-12-22 DIAGNOSIS — F0781 Postconcussional syndrome: Secondary | ICD-10-CM | POA: Diagnosis not present

## 2023-12-22 DIAGNOSIS — S81012A Laceration without foreign body, left knee, initial encounter: Secondary | ICD-10-CM | POA: Diagnosis not present

## 2023-12-22 DIAGNOSIS — Y92009 Unspecified place in unspecified non-institutional (private) residence as the place of occurrence of the external cause: Secondary | ICD-10-CM | POA: Diagnosis not present

## 2023-12-23 DIAGNOSIS — L03116 Cellulitis of left lower limb: Secondary | ICD-10-CM | POA: Diagnosis not present

## 2023-12-23 DIAGNOSIS — E7800 Pure hypercholesterolemia, unspecified: Secondary | ICD-10-CM | POA: Diagnosis not present

## 2023-12-23 DIAGNOSIS — I252 Old myocardial infarction: Secondary | ICD-10-CM | POA: Diagnosis not present

## 2023-12-23 DIAGNOSIS — Z882 Allergy status to sulfonamides status: Secondary | ICD-10-CM | POA: Diagnosis not present

## 2023-12-23 DIAGNOSIS — Z79899 Other long term (current) drug therapy: Secondary | ICD-10-CM | POA: Diagnosis not present

## 2023-12-23 DIAGNOSIS — R002 Palpitations: Secondary | ICD-10-CM | POA: Diagnosis not present

## 2023-12-23 DIAGNOSIS — L7682 Other postprocedural complications of skin and subcutaneous tissue: Secondary | ICD-10-CM | POA: Diagnosis not present

## 2023-12-23 DIAGNOSIS — Z888 Allergy status to other drugs, medicaments and biological substances status: Secondary | ICD-10-CM | POA: Diagnosis not present

## 2023-12-23 DIAGNOSIS — Z96653 Presence of artificial knee joint, bilateral: Secondary | ICD-10-CM | POA: Diagnosis not present

## 2023-12-23 DIAGNOSIS — Z792 Long term (current) use of antibiotics: Secondary | ICD-10-CM | POA: Diagnosis not present

## 2023-12-23 DIAGNOSIS — R519 Headache, unspecified: Secondary | ICD-10-CM | POA: Diagnosis not present

## 2023-12-23 DIAGNOSIS — E871 Hypo-osmolality and hyponatremia: Secondary | ICD-10-CM | POA: Diagnosis not present

## 2023-12-23 DIAGNOSIS — K219 Gastro-esophageal reflux disease without esophagitis: Secondary | ICD-10-CM | POA: Diagnosis not present

## 2023-12-23 DIAGNOSIS — S81012A Laceration without foreign body, left knee, initial encounter: Secondary | ICD-10-CM | POA: Diagnosis not present

## 2023-12-23 DIAGNOSIS — Z96652 Presence of left artificial knee joint: Secondary | ICD-10-CM | POA: Diagnosis not present

## 2023-12-23 DIAGNOSIS — I10 Essential (primary) hypertension: Secondary | ICD-10-CM | POA: Diagnosis not present

## 2023-12-23 DIAGNOSIS — M1990 Unspecified osteoarthritis, unspecified site: Secondary | ICD-10-CM | POA: Diagnosis not present

## 2023-12-23 DIAGNOSIS — E039 Hypothyroidism, unspecified: Secondary | ICD-10-CM | POA: Diagnosis not present

## 2023-12-23 DIAGNOSIS — M542 Cervicalgia: Secondary | ICD-10-CM | POA: Diagnosis not present

## 2023-12-28 DIAGNOSIS — R519 Headache, unspecified: Secondary | ICD-10-CM | POA: Diagnosis not present

## 2023-12-28 DIAGNOSIS — M47812 Spondylosis without myelopathy or radiculopathy, cervical region: Secondary | ICD-10-CM | POA: Diagnosis not present

## 2023-12-28 DIAGNOSIS — E871 Hypo-osmolality and hyponatremia: Secondary | ICD-10-CM | POA: Diagnosis not present

## 2023-12-28 DIAGNOSIS — L03116 Cellulitis of left lower limb: Secondary | ICD-10-CM | POA: Diagnosis not present

## 2023-12-28 DIAGNOSIS — S81012D Laceration without foreign body, left knee, subsequent encounter: Secondary | ICD-10-CM | POA: Diagnosis not present

## 2023-12-28 DIAGNOSIS — Z96652 Presence of left artificial knee joint: Secondary | ICD-10-CM | POA: Diagnosis not present

## 2024-01-04 DIAGNOSIS — B356 Tinea cruris: Secondary | ICD-10-CM | POA: Diagnosis not present

## 2024-01-15 DIAGNOSIS — R011 Cardiac murmur, unspecified: Secondary | ICD-10-CM | POA: Diagnosis not present

## 2024-01-15 DIAGNOSIS — I129 Hypertensive chronic kidney disease with stage 1 through stage 4 chronic kidney disease, or unspecified chronic kidney disease: Secondary | ICD-10-CM | POA: Diagnosis not present

## 2024-01-15 DIAGNOSIS — N400 Enlarged prostate without lower urinary tract symptoms: Secondary | ICD-10-CM | POA: Diagnosis not present

## 2024-01-15 DIAGNOSIS — K219 Gastro-esophageal reflux disease without esophagitis: Secondary | ICD-10-CM | POA: Diagnosis not present

## 2024-01-15 DIAGNOSIS — E039 Hypothyroidism, unspecified: Secondary | ICD-10-CM | POA: Diagnosis not present

## 2024-01-15 DIAGNOSIS — Z9849 Cataract extraction status, unspecified eye: Secondary | ICD-10-CM | POA: Diagnosis not present

## 2024-01-15 DIAGNOSIS — I2510 Atherosclerotic heart disease of native coronary artery without angina pectoris: Secondary | ICD-10-CM | POA: Diagnosis not present

## 2024-01-15 DIAGNOSIS — H3530 Unspecified macular degeneration: Secondary | ICD-10-CM | POA: Diagnosis not present

## 2024-01-15 DIAGNOSIS — E669 Obesity, unspecified: Secondary | ICD-10-CM | POA: Diagnosis not present

## 2024-01-15 DIAGNOSIS — N183 Chronic kidney disease, stage 3 unspecified: Secondary | ICD-10-CM | POA: Diagnosis not present

## 2024-01-15 DIAGNOSIS — E785 Hyperlipidemia, unspecified: Secondary | ICD-10-CM | POA: Diagnosis not present

## 2024-01-15 DIAGNOSIS — Z7982 Long term (current) use of aspirin: Secondary | ICD-10-CM | POA: Diagnosis not present

## 2024-02-06 ENCOUNTER — Ambulatory Visit: Attending: Cardiology | Admitting: Cardiology

## 2024-02-06 ENCOUNTER — Ambulatory Visit: Attending: Cardiology

## 2024-02-06 VITALS — BP 132/78 | HR 63 | Ht 67.0 in | Wt 195.6 lb

## 2024-02-06 DIAGNOSIS — E538 Deficiency of other specified B group vitamins: Secondary | ICD-10-CM | POA: Diagnosis not present

## 2024-02-06 DIAGNOSIS — E782 Mixed hyperlipidemia: Secondary | ICD-10-CM

## 2024-02-06 DIAGNOSIS — R7301 Impaired fasting glucose: Secondary | ICD-10-CM | POA: Diagnosis not present

## 2024-02-06 DIAGNOSIS — N411 Chronic prostatitis: Secondary | ICD-10-CM | POA: Diagnosis not present

## 2024-02-06 DIAGNOSIS — I10 Essential (primary) hypertension: Secondary | ICD-10-CM | POA: Diagnosis not present

## 2024-02-06 DIAGNOSIS — E559 Vitamin D deficiency, unspecified: Secondary | ICD-10-CM | POA: Diagnosis not present

## 2024-02-06 DIAGNOSIS — I4729 Other ventricular tachycardia: Secondary | ICD-10-CM | POA: Diagnosis not present

## 2024-02-06 DIAGNOSIS — K219 Gastro-esophageal reflux disease without esophagitis: Secondary | ICD-10-CM | POA: Diagnosis not present

## 2024-02-06 DIAGNOSIS — I2510 Atherosclerotic heart disease of native coronary artery without angina pectoris: Secondary | ICD-10-CM

## 2024-02-06 DIAGNOSIS — I350 Nonrheumatic aortic (valve) stenosis: Secondary | ICD-10-CM

## 2024-02-06 DIAGNOSIS — N401 Enlarged prostate with lower urinary tract symptoms: Secondary | ICD-10-CM | POA: Diagnosis not present

## 2024-02-06 DIAGNOSIS — K222 Esophageal obstruction: Secondary | ICD-10-CM | POA: Diagnosis not present

## 2024-02-06 DIAGNOSIS — E039 Hypothyroidism, unspecified: Secondary | ICD-10-CM | POA: Diagnosis not present

## 2024-02-06 DIAGNOSIS — E785 Hyperlipidemia, unspecified: Secondary | ICD-10-CM | POA: Diagnosis not present

## 2024-02-06 NOTE — Patient Instructions (Signed)

## 2024-02-06 NOTE — Addendum Note (Signed)
 Addended by: ONEITA BERLINER on: 02/06/2024 04:32 PM   Modules accepted: Orders

## 2024-02-06 NOTE — Progress Notes (Signed)
 Cardiology Office Note:    Date:  02/06/2024   ID:  Duane Hamilton, DOB 1945-10-09, MRN [id number]  PCP:  Keren Vicenta BRAVO, MD  Cardiologist:  Jennifer JONELLE Crape, MD   Referring MD: Keren Vicenta BRAVO, MD    ASSESSMENT:    1. Coronary artery disease involving native coronary artery of native heart without angina pectoris   2. Aortic valve stenosis, etiology of cardiac valve disease unspecified   3. Essential hypertension   4. Nonsustained ventricular tachycardia (HCC)   5. Mixed hyperlipidemia    PLAN:    In order of problems listed above:  Coronary artery disease: Secondary prevention stressed with the patient.  Importance of compliance with diet medication stressed and patient verbalized standing.  He is an active gentleman and walks on a regular basis. Palpitations: Unclear what the etiology of this is.  He will wear a 2-week monitor for evaluation.  He is agreeable. Essential hypertension: Blood pressure is stable and diet was emphasized. Mixed dyslipidemia: On lipid-lowering medications followed by primary care. Moderate aortic stenosis: Stable.  Symptoms education was given to the patient.  Questions were answered to his satisfaction.  His son accompanied him for this visit. Patient will be seen in follow-up appointment in 6 months or earlier if the patient has any concerns.    Medication Adjustments/Labs and Tests Ordered: Current medicines are reviewed at length with the patient today.  Concerns regarding medicines are outlined above.  Orders Placed This Encounter  Procedures   EKG 12-Lead   No orders of the defined types were placed in this encounter.    No chief complaint on file.    History of Present Illness:    Duane Hamilton is a 78 y.o. male.  Patient has past medical history of coronary artery disease, essential hypertension, mixed dyslipidemia and moderate aortic valve stenosis.  He denies any problems at this time except he has palpitations which  are significant and makes him feel weak.  He had 1 past few days.  He denies any chest pain orthopnea PND with this.  No dizziness or any syncope.  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    cetirizine (ZYRTEC) 10 MG tablet Take 10 mg by mouth as needed for allergies or rhinitis.   clotrimazole-betamethasone (LOTRISONE) cream Apply 1 Application topically at bedtime.   doxycycline (VIBRA-TABS) 100 MG tablet Take 100 mg by mouth daily.   finasteride (PROSCAR) 5 MG tablet Take 5 mg by mouth daily.   levothyroxine (SYNTHROID, LEVOTHROID) 75 MCG tablet Take 75 mcg by mouth daily  before breakfast.    metoprolol  succinate (TOPROL -XL) 100 MG 24 hr tablet Take 1 tablet (100 mg total) by mouth daily.   nitroGLYCERIN  (NITROSTAT ) 0.4 MG SL tablet Place 1 tablet (0.4 mg total) under the tongue every 5 (five) minutes as needed for chest pain.   pantoprazole (PROTONIX) 40 MG tablet Take 40 mg by mouth daily.   simvastatin (ZOCOR) 40 MG tablet Take 40 mg by mouth daily at 6 PM.    tamsulosin (FLOMAX) 0.4 MG CAPS  capsule Take 0.4 mg by mouth daily.      Allergies:   Lisinopril , Sulfa antibiotics, and Tizanidine   Social History   Socioeconomic History   Marital status: Married    Spouse name: Not on file   Number of children: Not on file   Years of education: Not on file   Highest education level: Not on file  Occupational History   Not on file  Tobacco Use   Smoking status: Never   Smokeless tobacco: Never  Vaping Use   Vaping status: Never Used  Substance and Sexual Activity   Alcohol use: No   Drug use: No   Sexual activity: Not on file  Other Topics Concern   Not on file  Social History Narrative   Not on file   Social Drivers of Health   Financial Resource Strain: Not on file  Food Insecurity: Not on file  Transportation Needs: Not on file  Physical Activity: Not on file  Stress: Not on file  Social Connections: Not on file     Family History: The patient's family history includes Atrial fibrillation in his father; Cancer in his mother; Heart disease in his sister; Hypertension in his mother.  ROS:   Please see the history of present illness.    All other systems reviewed and are negative.  EKGs/Labs/Other Studies Reviewed:    The following studies were reviewed today: .SABRAEKG Interpretation Date/Time:  Tuesday February 06 2024 15:42:26 EDT Ventricular Rate:  63 PR Interval:  168 QRS Duration:  84 QT Interval:  388 QTC Calculation: 397 R Axis:   -7  Text Interpretation: Normal sinus rhythm Left ventricular hypertrophy Inferior infarct , age undetermined Abnormal ECG When compared with ECG of 09-Nov-2022 14:17, Vent. rate has decreased BY  40 BPM Inferior infarct is now Present Confirmed by Edwyna Backers [phone]) on 02/06/2024 4:00:38 PM     Recent Labs: No results found for requested labs within last 365 days.  Recent Lipid Panel    Component Value Date/Time   CHOL 119 11/20/2020 0823   TRIG 82 11/20/2020 0823   HDL 42 11/20/2020 0823   CHOLHDL 2.8  11/20/2020 0823   LDLCALC 61 11/20/2020 0823    Physical Exam:    VS:  BP 132/78   Pulse 63   Ht 5' 7 (1.702 m)   Wt 195 lb 9.6 oz (88.7 kg)   SpO2 97%   BMI 30.64 kg/m     Wt Readings from Last 3 Encounters:  02/06/24 195 lb 9.6 oz (88.7 kg)  07/31/23 196 lb 12.8 oz (89.3 kg)  11/09/22 195 lb 9.6 oz (88.7 kg)     GEN: Patient is in no acute distress HEENT: Normal NECK: No JVD; No carotid bruits LYMPHATICS: No lymphadenopathy CARDIAC: Hear sounds regular, 2/6 systolic murmur at the apex. RESPIRATORY:  Clear to auscultation without rales, wheezing or rhonchi  ABDOMEN: Soft, non-tender, non-distended MUSCULOSKELETAL:  No edema; No deformity  SKIN: Warm and dry NEUROLOGIC:  Alert and  oriented x 3 PSYCHIATRIC:  Normal affect   Signed, Jennifer JONELLE Crape, MD  02/06/2024 4:01 PM    [facility]

## 2024-02-15 DIAGNOSIS — N492 Inflammatory disorders of scrotum: Secondary | ICD-10-CM | POA: Diagnosis not present

## 2024-02-16 ENCOUNTER — Telehealth: Payer: Self-pay | Admitting: Cardiology

## 2024-02-16 NOTE — Telephone Encounter (Signed)
 Pt called requesting a c/b from Nurse [facility] concerning a heart monitor. Pt left a telephone number of [phone] please advise

## 2024-02-19 NOTE — Telephone Encounter (Signed)
 Pt states that he thought that Dr. Edwyna wanted him to wear for 28 days. Advised that if his results show something we need to monitor longer we will order a second monitor at that  time. Pt verbalized understanding and had no additional questions.

## 2024-02-21 DIAGNOSIS — H353131 Nonexudative age-related macular degeneration, bilateral, early dry stage: Secondary | ICD-10-CM | POA: Diagnosis not present

## 2024-02-22 DIAGNOSIS — G8929 Other chronic pain: Secondary | ICD-10-CM | POA: Diagnosis not present

## 2024-02-22 DIAGNOSIS — M4310 Spondylolisthesis, site unspecified: Secondary | ICD-10-CM | POA: Diagnosis not present

## 2024-02-22 DIAGNOSIS — M25551 Pain in right hip: Secondary | ICD-10-CM | POA: Diagnosis not present

## 2024-02-26 DIAGNOSIS — I4729 Other ventricular tachycardia: Secondary | ICD-10-CM | POA: Diagnosis not present

## 2024-02-28 ENCOUNTER — Ambulatory Visit: Payer: Self-pay | Admitting: Cardiology

## 2024-02-28 DIAGNOSIS — I4729 Other ventricular tachycardia: Secondary | ICD-10-CM | POA: Diagnosis not present

## 2024-03-05 DIAGNOSIS — Z96651 Presence of right artificial knee joint: Secondary | ICD-10-CM | POA: Diagnosis not present

## 2024-03-05 DIAGNOSIS — R202 Paresthesia of skin: Secondary | ICD-10-CM | POA: Diagnosis not present

## 2024-03-12 DIAGNOSIS — L304 Erythema intertrigo: Secondary | ICD-10-CM | POA: Diagnosis not present

## 2024-03-12 DIAGNOSIS — L219 Seborrheic dermatitis, unspecified: Secondary | ICD-10-CM | POA: Diagnosis not present

## 2024-03-12 DIAGNOSIS — B351 Tinea unguium: Secondary | ICD-10-CM | POA: Diagnosis not present
